# Patient Record
Sex: Female | Born: 1995 | Race: Black or African American | Hispanic: No | Marital: Single | Smoking: Current every day smoker
Health system: Southern US, Community
[De-identification: ages and names within clinical notes are randomized; demographics above are authoritative.]

## PROBLEM LIST (undated history)

## (undated) DIAGNOSIS — R0600 Dyspnea, unspecified: Secondary | ICD-10-CM

---
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
## 2014-03-13 ENCOUNTER — Emergency Department: Payer: Self-pay | Admitting: Emergency Medicine

## 2014-03-16 ENCOUNTER — Emergency Department: Payer: Self-pay | Admitting: Emergency Medicine

## 2014-06-27 ENCOUNTER — Emergency Department: Payer: Self-pay | Admitting: Emergency Medicine

## 2014-09-04 ENCOUNTER — Emergency Department: Payer: Self-pay | Admitting: Emergency Medicine

## 2014-12-18 ENCOUNTER — Emergency Department
Admission: EM | Admit: 2014-12-18 | Discharge: 2014-12-18 | Disposition: A | Discharge status: Home / Self Care | Payer: Self-pay | Attending: Emergency Medicine | Admitting: Emergency Medicine

## 2014-12-18 ENCOUNTER — Encounter: Payer: Self-pay | Admitting: Emergency Medicine

## 2014-12-18 ENCOUNTER — Emergency Department: Payer: Self-pay

## 2014-12-18 DIAGNOSIS — M79601 Pain in right arm: Secondary | ICD-10-CM

## 2014-12-18 DIAGNOSIS — M779 Enthesopathy, unspecified: Secondary | ICD-10-CM | POA: Insufficient documentation

## 2014-12-18 DIAGNOSIS — M79631 Pain in right forearm: Secondary | ICD-10-CM | POA: Insufficient documentation

## 2014-12-18 DIAGNOSIS — Z3202 Encounter for pregnancy test, result negative: Secondary | ICD-10-CM | POA: Insufficient documentation

## 2014-12-18 LAB — POCT PREGNANCY, URINE: Preg Test, Ur: NEGATIVE

## 2014-12-18 MED ORDER — CYCLOBENZAPRINE HCL 10 MG PO TABS: 10.0000 mg | ORAL_TABLET | Freq: Three times a day (TID) | ORAL | Status: DC | PRN

## 2014-12-18 MED ORDER — MELOXICAM 15 MG PO TABS: 15.0000 mg | ORAL_TABLET | Freq: Every day | ORAL | Status: DC | PRN

## 2014-12-18 NOTE — ED Notes (Signed)
Pt here with c/o right forearm pain x2 weeks.

## 2014-12-18 NOTE — ED Provider Notes (Signed)
[facility] Emergency Department Provider Note  ____________________________________________  Time seen: Approximately  155am  I have reviewed the triage vital signs and the nursing notes.   HISTORY  Chief Complaint Wrist Pain   HPI Cindy Rodriguez is a 19 y.o. female presents to the ER for complaint of right forearm pain for approximately 2 weeks. Patient states it was a gradual onset of pain that has continued to worsen. Patient denies known fall or injury. Patient states that she feels that the pain start in the forearm then intermittently shoots upwards and downwards. States the radiating pain is a burning type sensation. Denies tingling or numbness to hand/right arm, or other body part. Patient denies any other pain.  Patient reports she has an approximately 20 pound daughter who is one year old that she is frequently carrying child. Patient states that she always carries her daughter on her right side holding with her right arm, holding her against her right side with her right forearm. States this is when she noticed pain at first. States pain is currently 0/10 when sitting still but states pain is 5/10 with movement. States pain only with movement.   Denies fever, chest pain, shortness of breath, abdominal pain or other pain.   History reviewed. No pertinent past medical history.  There are no active problems to display for this patient.   History reviewed. No pertinent past surgical history.  Current Outpatient Rx  Name  Route  Sig  Dispense  Refill                          Allergies Review of patient's allergies indicates no known allergies.  No family history on file.  Social History History  Substance Use Topics  . Smoking status: Never Smoker   . Smokeless tobacco: Not on file  . Alcohol Use: No    Review of Systems Constitutional: No fever/chills Eyes: No visual changes. ENT: No sore throat. Cardiovascular: Denies chest  pain. Respiratory: Denies shortness of breath. Gastrointestinal: No abdominal pain.  No nausea, no vomiting.  No diarrhea.  No constipation. Genitourinary: Negative for dysuria. Musculoskeletal: Negative for back pain. Skin: Negative for rash. Neurological: Negative for headaches, focal weakness or numbness.  10-point ROS otherwise negative.  ____________________________________________   PHYSICAL EXAM:  VITAL SIGNS: ED Triage Vitals  Enc Vitals Group     BP 12/18/14 1111 130/77 mmHg     Pulse Rate 12/18/14 1111 72     Resp 12/18/14 1111 20     Temp 12/18/14 1111 97.6 F (36.4 C)     Temp Source 12/18/14 1111 Oral     SpO2 12/18/14 1111 100 %     Weight 12/18/14 1111 200 lb (90.719 kg)     Height 12/18/14 1111  (1.549 m)     Head Cir --      Peak Flow --      Pain Score 12/18/14 1112 5     Pain Loc --      Pain Edu? --      Excl. in GC? --     Constitutional: Alert and oriented. Well appearing and in no acute distress. Eyes: Conjunctivae are normal. PERRL. EOMI. Head: Atraumatic. Nose: No congestion/rhinnorhea. Mouth/Throat: Mucous membranes are moist.  Oropharynx non-erythematous. Neck: No stridor.  No cervical spine tenderness to palpation Hematological/Lymphatic/Immunilogical: No cervical lymphadenopathy. Cardiovascular: Normal rate, regular rhythm. Grossly normal heart sounds.  Good peripheral circulation. Respiratory: Normal respiratory effort.  No retractions. Lungs CTAB. Gastrointestinal: Soft and nontender. No distention. No abdominal bruits. No CVA tenderness. Musculoskeletal: No lower extremity tenderness nor edema.  No joint effusions. Right proximal forearm with mild to mod TTP, no swelling, no erythema, skin intact. Pain increases at right proximal forearm with wrist rotation and flexion. No other TTP to right arm. Bilateral hand grips equal. Distal radial pulses equal bilaterally. Full ROM present. Pain in forearm improved with support and pressure  applied to right forearm flexor and extensor muscles.  Left upper extremity nontender.  Neurologic:  Normal speech and language. No gross focal neurologic deficits are appreciated. Speech is normal. No gait instability. Skin:  Skin is warm, dry and intact. No rash noted. Psychiatric: Mood and affect are normal. Speech and behavior are normal.  ____________________________________________   LABS (all labs ordered are listed, but only abnormal results are displayed)  Labs Reviewed  POCT PREGNANCY, URINE  negative _ RADIOLOGY  RIGHT FOREARM - 2 VIEW  COMPARISON: None.  FINDINGS: There is no evidence of fracture or other focal bone lesions. Soft tissues are unremarkable.  IMPRESSION: Normal right forearm.   Electronically Signed By: Lupita RaiderJames Green Jr, M.D. On: 12/18/2014 11:58 ____________________________________________   PROCEDURES  Procedure(s) performed:  SPLINT APPLICATION Date/Time: 1330 pm Authorized by: Renford DillsLindsey Yehonatan Grandison Consent: Verbal consent obtained. Risks and benefits: risks, benefits and alternatives were discussed Consent given by: patient Splint applied by: ed technician Location details: right arm sling Post-procedure: The splinted body part was neurovascularly unchanged following the procedure. Patient tolerance: Patient tolerated the procedure well with no immediate complications.   ____________________________________________    INITIAL IMPRESSION / ASSESSMENT AND PLAN / ED COURSE  Pertinent labs & imaging results that were available during my care of the patient were reviewed by me and considered in my medical decision making (see chart for details).   Right forearm with mild to mod soft tissue TTP, Full ROM. No known injury, xray negative. No signs of infection. Suspect muscle strain vs tendinitis as patient frequently carrying 20 year old  With right arm. Rest, ice, antiinflammatories. Stretch and sling as needed for support. Follow up  with PCP or orthopedic as needed. Discussed return parameters.  ____________________________________________   FINAL CLINICAL IMPRESSION(S) / ED DIAGNOSES  Final diagnoses:  Right arm pain  Tendinitis     Renford DillsLindsey Latica Hohmann, NP 12/18/14 1410  Minna AntisKevin Paduchowski, MD 12/18/14 [phone]

## 2014-12-18 NOTE — Discharge Instructions (Signed)
Take medication as prescribed. Apply ice and stretch multiple times per day as discussed. Worsening for 2 or 3 days to help rest however continue to stretch.   Follow-up with primary care physician or orthopedic next week as needed for continued pain   Return to the ER for new or worsening concerns.   Tendinitis Tendinitis is swelling and inflammation of the tendons. Tendons are band-like tissues that connect muscle to bone. Tendinitis commonly occurs in the:   Shoulders (rotator cuff).  Heels (Achilles tendon).  Elbows (triceps tendon). CAUSES Tendinitis is usually caused by overusing the tendon, muscles, and joints involved. When the tissue surrounding a tendon (synovium) becomes inflamed, it is called tenosynovitis. Tendinitis commonly develops in people whose jobs require repetitive motions. SYMPTOMS  Pain.  Tenderness.  Mild swelling. DIAGNOSIS Tendinitis is usually diagnosed by physical exam. Your health care provider may also order X-rays or other imaging tests. TREATMENT Your health care provider may recommend certain medicines or exercises for your treatment. HOME CARE INSTRUCTIONS   Use a sling or splint for as long as directed by your health care provider until the pain decreases.  Put ice on the injured area.  Put ice in a plastic bag.  Place a towel between your skin and the bag.  Leave the ice on for 15-20 minutes, 3-4 times a day, or as directed by your health care provider.  Avoid using the limb while the tendon is painful. Perform gentle range of motion exercises only as directed by your health care provider. Stop exercises if pain or discomfort increase, unless directed otherwise by your health care provider.  Only take over-the-counter or prescription medicines for pain, discomfort, or fever as directed by your health care provider. SEEK MEDICAL CARE IF:   Your pain and swelling increase.  You develop new, unexplained symptoms, especially increased  numbness in the hands. MAKE SURE YOU:   Understand these instructions.  Will watch your condition.  Will get help right away if you are not doing well or get worse. Document Released: 07/07/2000 Document Revised: 11/24/2013 Document Reviewed: 09/26/2010 [facility] Patient Information 2015 [place]. This information is not intended to replace advice given to you by your health care provider. Make sure you discuss any questions you have with your health care provider.  Musculoskeletal Pain Musculoskeletal pain is muscle and boney aches and pains. These pains can occur in any part of the body. Your caregiver may treat you without knowing the cause of the pain. They may treat you if blood or urine tests, X-rays, and other tests were normal.  CAUSES There is often not a definite cause or reason for these pains. These pains may be caused by a type of germ (virus). The discomfort may also come from overuse. Overuse includes working out too hard when your body is not fit. Boney aches also come from weather changes. Bone is sensitive to atmospheric pressure changes. HOME CARE INSTRUCTIONS   Ask when your test results will be ready. Make sure you get your test results.  Only take over-the-counter or prescription medicines for pain, discomfort, or fever as directed by your caregiver. If you were given medications for your condition, do not drive, operate machinery or power tools, or sign legal documents for 24 hours. Do not drink alcohol. Do not take sleeping pills or other medications that may interfere with treatment.  Continue all activities unless the activities cause more pain. When the pain lessens, slowly resume normal activities. Gradually increase the intensity and duration  of the activities or exercise.  During periods of severe pain, bed rest may be helpful. Lay or sit in any position that is comfortable.  Putting ice on the injured area.  Put ice in a bag.  Place a towel between  your skin and the bag.  Leave the ice on for 15 to 20 minutes, 3 to 4 times a day.  Follow up with your caregiver for continued problems and no reason can be found for the pain. If the pain becomes worse or does not go away, it may be necessary to repeat tests or do additional testing. Your caregiver may need to look further for a possible cause. SEEK IMMEDIATE MEDICAL CARE IF:  You have pain that is getting worse and is not relieved by medications.  You develop chest pain that is associated with shortness or breath, sweating, feeling sick to your stomach (nauseous), or throw up (vomit).  Your pain becomes localized to the abdomen.  You develop any new symptoms that seem different or that concern you. MAKE SURE YOU:   Understand these instructions.  Will watch your condition.  Will get help right away if you are not doing well or get worse. Document Released: 07/10/2005 Document Revised: 10/02/2011 Document Reviewed: 03/14/2013 [facility] Patient Information 2015 [place], [place]. This information is not intended to replace advice given to you by your health care provider. Make sure you discuss any questions you have with your health care provider.

## 2014-12-18 NOTE — ED Notes (Signed)
Pt informed to return if any life threatening symptoms occur.  

## 2015-04-07 ENCOUNTER — Encounter: Payer: Self-pay | Admitting: Emergency Medicine

## 2015-04-07 ENCOUNTER — Emergency Department
Admission: EM | Admit: 2015-04-07 | Discharge: 2015-04-07 | Discharge status: Against Medical Advice | Payer: Self-pay | Attending: Emergency Medicine | Admitting: Emergency Medicine

## 2015-04-07 DIAGNOSIS — S61451A Open bite of right hand, initial encounter: Secondary | ICD-10-CM | POA: Insufficient documentation

## 2015-04-07 DIAGNOSIS — Y9289 Other specified places as the place of occurrence of the external cause: Secondary | ICD-10-CM | POA: Insufficient documentation

## 2015-04-07 DIAGNOSIS — Y998 Other external cause status: Secondary | ICD-10-CM | POA: Insufficient documentation

## 2015-04-07 DIAGNOSIS — Y9389 Activity, other specified: Secondary | ICD-10-CM | POA: Insufficient documentation

## 2015-04-07 NOTE — ED Notes (Signed)
Pt called x 3 with no response by ED tech
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
## 2015-11-24 ENCOUNTER — Emergency Department: Payer: No Typology Code available for payment source

## 2015-11-24 ENCOUNTER — Emergency Department
Admission: EM | Admit: 2015-11-24 | Discharge: 2015-11-24 | Disposition: A | Discharge status: Home / Self Care | Payer: No Typology Code available for payment source | Attending: Emergency Medicine | Admitting: Emergency Medicine

## 2015-11-24 DIAGNOSIS — Y9389 Activity, other specified: Secondary | ICD-10-CM | POA: Diagnosis not present

## 2015-11-24 DIAGNOSIS — Y999 Unspecified external cause status: Secondary | ICD-10-CM | POA: Diagnosis not present

## 2015-11-24 DIAGNOSIS — Z23 Encounter for immunization: Secondary | ICD-10-CM | POA: Insufficient documentation

## 2015-11-24 DIAGNOSIS — S0181XA Laceration without foreign body of other part of head, initial encounter: Secondary | ICD-10-CM | POA: Diagnosis not present

## 2015-11-24 DIAGNOSIS — Y92410 Unspecified street and highway as the place of occurrence of the external cause: Secondary | ICD-10-CM | POA: Insufficient documentation

## 2015-11-24 DIAGNOSIS — IMO0002 Reserved for concepts with insufficient information to code with codable children: Secondary | ICD-10-CM

## 2015-11-24 DIAGNOSIS — R51 Headache: Secondary | ICD-10-CM | POA: Diagnosis present

## 2015-11-24 LAB — POCT PREGNANCY, URINE: Preg Test, Ur: NEGATIVE

## 2015-11-24 MED ORDER — DOUBLE ANTIBIOTIC 500-10000 UNIT/GM EX OINT: TOPICAL_OINTMENT | Freq: Two times a day (BID) | CUTANEOUS | Status: DC

## 2015-11-24 MED ORDER — TETANUS-DIPHTH-ACELL PERTUSSIS 5-2.5-18.5 LF-MCG/0.5 IM SUSP
0.5000 mL | Freq: Once | INTRAMUSCULAR | Status: AC
  Administered 2015-11-24: 0.5 mL via INTRAMUSCULAR
  Filled 2015-11-24: qty 0.5

## 2015-11-24 MED ORDER — TRAMADOL HCL 50 MG PO TABS
50.0000 mg | ORAL_TABLET | Freq: Once | ORAL | Status: AC
  Administered 2015-11-24: 50 mg via ORAL
  Filled 2015-11-24: qty 1

## 2015-11-24 MED ORDER — BACITRACIN ZINC 500 UNIT/GM EX OINT
TOPICAL_OINTMENT | CUTANEOUS | Status: AC
  Administered 2015-11-24: 1
  Filled 2015-11-24: qty 0.9

## 2015-11-24 MED ORDER — TRAMADOL HCL 50 MG PO TABS: 50.0000 mg | ORAL_TABLET | Freq: Four times a day (QID) | ORAL | Status: AC | PRN
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
## 2015-11-26 ENCOUNTER — Emergency Department: Payer: No Typology Code available for payment source

## 2015-11-26 ENCOUNTER — Emergency Department
Admission: EM | Admit: 2015-11-26 | Discharge: 2015-11-26 | Disposition: A | Discharge status: Home / Self Care | Payer: No Typology Code available for payment source | Attending: Emergency Medicine | Admitting: Emergency Medicine

## 2015-11-26 ENCOUNTER — Encounter: Payer: Self-pay | Admitting: Emergency Medicine

## 2015-11-26 DIAGNOSIS — Z79899 Other long term (current) drug therapy: Secondary | ICD-10-CM | POA: Diagnosis not present

## 2015-11-26 DIAGNOSIS — M79601 Pain in right arm: Secondary | ICD-10-CM | POA: Diagnosis not present

## 2015-11-26 DIAGNOSIS — F418 Other specified anxiety disorders: Secondary | ICD-10-CM | POA: Diagnosis not present

## 2015-11-26 DIAGNOSIS — T148 Other injury of unspecified body region: Secondary | ICD-10-CM | POA: Insufficient documentation

## 2015-11-26 DIAGNOSIS — Y9389 Activity, other specified: Secondary | ICD-10-CM | POA: Insufficient documentation

## 2015-11-26 DIAGNOSIS — S4991XA Unspecified injury of right shoulder and upper arm, initial encounter: Secondary | ICD-10-CM | POA: Diagnosis present

## 2015-11-26 DIAGNOSIS — T148XXA Other injury of unspecified body region, initial encounter: Secondary | ICD-10-CM

## 2015-11-26 DIAGNOSIS — Y92410 Unspecified street and highway as the place of occurrence of the external cause: Secondary | ICD-10-CM | POA: Insufficient documentation

## 2015-11-26 DIAGNOSIS — G8911 Acute pain due to trauma: Secondary | ICD-10-CM

## 2015-11-26 DIAGNOSIS — Y999 Unspecified external cause status: Secondary | ICD-10-CM | POA: Diagnosis not present

## 2015-11-26 MED ORDER — DIAZEPAM 2 MG PO TABS: 2.0000 mg | ORAL_TABLET | Freq: Three times a day (TID) | ORAL | Status: DC | PRN

## 2015-11-26 MED ORDER — HYDROCODONE-ACETAMINOPHEN 5-325 MG PO TABS: 1.0000 | ORAL_TABLET | ORAL | Status: DC | PRN
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
## 2016-06-30 ENCOUNTER — Emergency Department
Admission: EM | Admit: 2016-06-30 | Discharge: 2016-06-30 | Disposition: A | Discharge status: Home / Self Care | Payer: Self-pay | Attending: Emergency Medicine | Admitting: Emergency Medicine

## 2016-06-30 DIAGNOSIS — F17210 Nicotine dependence, cigarettes, uncomplicated: Secondary | ICD-10-CM | POA: Insufficient documentation

## 2016-06-30 DIAGNOSIS — Z79899 Other long term (current) drug therapy: Secondary | ICD-10-CM | POA: Insufficient documentation

## 2016-06-30 DIAGNOSIS — N938 Other specified abnormal uterine and vaginal bleeding: Secondary | ICD-10-CM | POA: Insufficient documentation

## 2016-06-30 DIAGNOSIS — Z791 Long term (current) use of non-steroidal anti-inflammatories (NSAID): Secondary | ICD-10-CM | POA: Insufficient documentation

## 2016-06-30 LAB — BASIC METABOLIC PANEL
Anion gap: 8 (ref 5–15)
BUN: 10 mg/dL (ref 6–20)
CO2: 24 mmol/L (ref 22–32)
Calcium: 9.1 mg/dL (ref 8.9–10.3)
Chloride: 107 mmol/L (ref 101–111)
Creatinine, Ser: 0.7 mg/dL (ref 0.44–1.00)
GFR calc Af Amer: >60 mL/min (ref >60)
GFR calc non Af Amer: >60 mL/min (ref >60)
Glucose, Bld: 83 mg/dL (ref 65–99)
Potassium: 3.9 mmol/L (ref 3.5–5.1)
Sodium: 139 mmol/L (ref 135–145)

## 2016-06-30 LAB — CBC
HCT: 38.6 % (ref 35.0–47.0)
Hemoglobin: 13.4 g/dL (ref 12.0–16.0)
MCH: 27.6 pg (ref 26.0–34.0)
MCHC: 34.7 g/dL (ref 32.0–36.0)
MCV: 79.4 fL — ABNORMAL LOW (ref 80.0–100.0)
Platelets: 248 10*3/uL (ref 150–440)
RBC: 4.86 MIL/uL (ref 3.80–5.20)
RDW: 14.3 % (ref 11.5–14.5)
WBC: 7.1 10*3/uL (ref 3.6–11.0)

## 2016-06-30 LAB — PREGNANCY, URINE: Preg Test, Ur: NEGATIVE

## 2016-06-30 LAB — URINALYSIS, COMPLETE (UACMP) WITH MICROSCOPIC
Bacteria, UA: NONE SEEN
Specific Gravity, Urine: 1.026 (ref 1.005–1.030)

## 2016-06-30 LAB — TROPONIN I: Troponin I: <0.03 ng/mL (ref <0.03)

## 2016-06-30 LAB — HCG, QUANTITATIVE, PREGNANCY: hCG, Beta Chain, Quant, S: <1 m[IU]/mL (ref <5)

## 2016-06-30 NOTE — ED Triage Notes (Addendum)
Pt came to Ed via pov c/o vaginal bleeding starting today. Reports heavy bleeding at first and has since slowed down. Reports last period October 30th.

## 2016-06-30 NOTE — ED Provider Notes (Signed)
[facility] Emergency Department Provider Note  Time seen: 1:31 PM  I have reviewed the triage vital signs and the nursing notes.   HISTORY  Chief Complaint Vaginal Bleeding    HPI Cindy Rodriguez is a 20 y.o. female with no past medical history who presents to the emergency department with vaginal bleeding. According to the patient she had her last period at the end of October. States she was laid on her. However over the past 2 days she has began with vaginal bleeding and lower abdominal cramping. Patient does not know if she is pregnant. She also states mild chest discomfort over the past 2-3 days. Denies vaginal discharge. Denies dysuria. Describes her lower abdominal pain is mild, cramping in nature.  History reviewed. No pertinent past medical history.  There are no active problems to display for this patient.   History reviewed. No pertinent surgical history.  Prior to Admission medications   Medication Sig Start Date End Date Taking? Authorizing Provider  cyclobenzaprine (FLEXERIL) 10 MG tablet Take 1 tablet (10 mg total) by mouth every 8 (eight) hours as needed for muscle spasms (PRN pain. Do not drive or operate heavy machinery while taking as can cause drowsiness.). 12/18/14   Renford DillsLindsey Miller, NP  diazepam (VALIUM) 2 MG tablet Take 1 tablet (2 mg total) by mouth every 8 (eight) hours as needed. 11/26/15   Chinita Pesterari B Triplett, FNP  HYDROcodone-acetaminophen (NORCO/VICODIN) 5-325 MG tablet Take 1 tablet by mouth every 4 (four) hours as needed for moderate pain. 11/26/15   Chinita Pesterari B Triplett, FNP  meloxicam (MOBIC) 15 MG tablet Take 1 tablet (15 mg total) by mouth daily as needed for pain. 12/18/14   Renford DillsLindsey Miller, NP  traMADol (ULTRAM) 50 MG tablet Take 1 tablet (50 mg total) by mouth every 6 (six) hours as needed. 11/24/15 11/23/16  Phineas SemenGraydon Goodman, MD    No Known Allergies  No family history on file.  Social History Social History  Substance Use Topics  .  Smoking status: Current Every Day Smoker    Packs/day: 0.50    Types: Cigarettes  . Smokeless tobacco: Never Used  . Alcohol use No    Review of Systems Constitutional: Negative for fever. Cardiovascular: Negative for chest pain. Respiratory: Negative for shortness of breath. Gastrointestinal: Mild lower abdominal cramping. Negative for nausea, vomiting, diarrhea. Genitourinary: Negative for dysuria. Positive for vaginal bleeding 2 days. Neurological: Negative for headache 10-point ROS otherwise negative.  ____________________________________________   PHYSICAL EXAM:  VITAL SIGNS: ED Triage Vitals [06/30/16 1018]  Enc Vitals Group     BP (!) 100/54     Pulse Rate 77     Resp 18     Temp 98.1 F (36.7 C)     Temp Source Oral     SpO2 98 %     Weight 261 lb 9.6 oz (118.7 kg)     Height 5\' 1"  (1.549 m)     Head Circumference      Peak Flow      Pain Score 7     Pain Loc      Pain Edu?      Excl. in GC?    Constitutional: Alert and oriented. Well appearing and in no distress. Eyes: Normal exam ENT   Head: Normocephalic and atraumatic.   Mouth/Throat: Mucous membranes are moist. Cardiovascular: Normal rate, regular rhythm. No murmur Respiratory: Normal respiratory effort without tachypnea nor retractions. Breath sounds are clear  Gastrointestinal: Soft and nontender. No distention.  Musculoskeletal: Nontender with normal range of motion in all extremities.  Neurologic:  Normal speech and language. No gross focal neurologic deficits  Skin:  Skin is warm, dry and intact.  Psychiatric: Mood and affect are normal  ____________________________________________    INITIAL IMPRESSION / ASSESSMENT AND PLAN / ED COURSE  Pertinent labs & imaging results that were available during my care of the patient were reviewed by me and considered in my medical decision making (see chart for details).  Patient presents the emergency department for vaginal bleeding for the  past 2 days. States it is very abnormal for her to have an irregular period, states she should've started her period almost 2 weeks ago, she is concerned that she might be pregnant. Patient's labs today are largely within normal limits. Urinalysis shows significant amount of blood but no bacteria. Patient has a largely normal workup. Did describe some chest tightness however her EKG is normal, troponin is normal. Suspect dysfunctional uterine bleeding. We will discharge home with OB/GYN follow-up. Patient agreeable.     EKG reviewed and interpreted by myself shows normal sinus rhythm at 69 bpm, narrow QRS, normal axis, normal intervals, no concerning ST changes. Overall normal EKG.  ____________________________________________   FINAL CLINICAL IMPRESSION(S) / ED DIAGNOSES  Vaginal bleeding    Minna AntisKevin Farhana Fellows, MD 06/30/16 1334

## 2016-06-30 NOTE — ED Notes (Signed)
Pt c/o vaginal bleeding that started this am with clots, states her LMP was oct 30th, missed period in November. Pt c/o abd cramping in lower abd.

## 2016-08-24 ENCOUNTER — Emergency Department
Admission: EM | Admit: 2016-08-24 | Discharge: 2016-08-24 | Disposition: A | Discharge status: Home / Self Care | Payer: Self-pay | Attending: Emergency Medicine | Admitting: Emergency Medicine

## 2016-08-24 ENCOUNTER — Encounter: Payer: Self-pay | Admitting: Emergency Medicine

## 2016-08-24 DIAGNOSIS — R51 Headache: Secondary | ICD-10-CM | POA: Insufficient documentation

## 2016-08-24 DIAGNOSIS — R519 Headache, unspecified: Secondary | ICD-10-CM

## 2016-08-24 DIAGNOSIS — F17210 Nicotine dependence, cigarettes, uncomplicated: Secondary | ICD-10-CM | POA: Insufficient documentation

## 2016-08-24 MED ORDER — BUTALBITAL-APAP-CAFFEINE 50-325-40 MG PO TABS: 1.0000 | ORAL_TABLET | Freq: Four times a day (QID) | ORAL | 0 refills | Status: AC | PRN

## 2016-08-24 NOTE — ED Provider Notes (Signed)
[facility] Emergency Department Provider Note  ____________________________________________   First MD Initiated Contact with Patient 08/24/16 1900     (approximate)  I have reviewed the triage vital signs and the nursing notes.   HISTORY  Chief Complaint Migraine   HPI Cindy Rodriguez is a 21 y.o. female with a history of headaches in the past was presented to the emergency department today with a right-sided headache that is been intermittent over the past 3 days. She says that the headache has been throbbing and last about 10-15 minutes before to [place]. She denies any nausea, vomiting or visual disturbances. She denies any weakness or numbness. Says that she has tried Percocet at home for her headache but has otherwise not had any relief. She says she has had similar headaches in the past.Denies any headache at this time. Denies any history of ruptured cerebral aneurysms in the past. Denies any sudden death at young age and pupil her family. Does not report any neck pain. Does not report this being the worst headache of her life.   History reviewed. No pertinent past medical history.  There are no active problems to display for this patient.   History reviewed. No pertinent surgical history.  Prior to Admission medications   Medication Sig Start Date End Date Taking? Authorizing Provider  cyclobenzaprine (FLEXERIL) 10 MG tablet Take 1 tablet (10 mg total) by mouth every 8 (eight) hours as needed for muscle spasms (PRN pain. Do not drive or operate heavy machinery while taking as can cause drowsiness.). 12/18/14   Renford DillsLindsey Miller, NP  diazepam (VALIUM) 2 MG tablet Take 1 tablet (2 mg total) by mouth every 8 (eight) hours as needed. 11/26/15   Chinita Pesterari B Triplett, FNP  HYDROcodone-acetaminophen (NORCO/VICODIN) 5-325 MG tablet Take 1 tablet by mouth every 4 (four) hours as needed for moderate pain. 11/26/15   Chinita Pesterari B Triplett, FNP  meloxicam (MOBIC) 15 MG tablet Take  1 tablet (15 mg total) by mouth daily as needed for pain. 12/18/14   Renford DillsLindsey Miller, NP  traMADol (ULTRAM) 50 MG tablet Take 1 tablet (50 mg total) by mouth every 6 (six) hours as needed. 11/24/15 11/23/16  Phineas SemenGraydon Goodman, MD    Allergies Patient has no known allergies.  No family history on file.  Social History Social History  Substance Use Topics  . Smoking status: Current Every Day Smoker    Packs/day: 0.50    Types: Cigarettes  . Smokeless tobacco: Never Used  . Alcohol use No    Review of Systems Constitutional: No fever/chills Eyes: No visual changes. ENT: No sore throat. Cardiovascular: Denies chest pain. Respiratory: Denies shortness of breath. Gastrointestinal: No abdominal pain.  No nausea, no vomiting.  No diarrhea.  No constipation. Genitourinary: Negative for dysuria. Musculoskeletal: Negative for back pain. Skin: Negative for rash. Neurological: Negative for focal weakness or numbness.  10-point ROS otherwise negative.  ____________________________________________   PHYSICAL EXAM:  VITAL SIGNS: ED Triage Vitals  Enc Vitals Group     BP 08/24/16 1804 (!) 102/50     Pulse Rate 08/24/16 1804 76     Resp 08/24/16 1804 16     Temp 08/24/16 1804 98.7 F (37.1 C)     Temp Source 08/24/16 1804 Oral     SpO2 08/24/16 1804 98 %     Weight 08/24/16 1806 200 lb (90.7 kg)     Height 08/24/16 1806 5\' 1"  (1.549 m)     Head Circumference --  Peak Flow --      Pain Score 08/24/16 1836 8     Pain Loc --      Pain Edu? --      Excl. in GC? --     Constitutional: Alert and oriented. Well appearing and in no acute distress. Eyes: Conjunctivae are normal. PERRL. EOMI. Head: Atraumatic. Nose: No congestion/rhinnorhea. Mouth/Throat: Mucous membranes are moist.   Neck: No stridor.  Ranges had neck freely without any pain or restriction observed. Cardiovascular: Normal rate, regular rhythm. Grossly normal heart sounds.   Respiratory: Normal respiratory effort.   No retractions. Lungs CTAB. Gastrointestinal: Soft and nontender. No distention.  Musculoskeletal: No lower extremity tenderness nor edema.  No joint effusions. Neurologic:  Normal speech and language. No gross focal neurologic deficits are appreciated. No gait instability. Walks unassisted with a normal gait. Skin:  Skin is warm, dry and intact. No rash noted. Psychiatric: Mood and affect are normal. Speech and behavior are normal.  ____________________________________________   LABS (all labs ordered are listed, but only abnormal results are displayed)  Labs Reviewed - No data to display ____________________________________________  EKG   ____________________________________________  RADIOLOGY   ____________________________________________   PROCEDURES  Procedure(s) performed:   Procedures  Critical Care performed:   ____________________________________________   INITIAL IMPRESSION / ASSESSMENT AND PLAN / ED COURSE  Pertinent labs & imaging results that were available during my care of the patient were reviewed by me and considered in my medical decision making (see chart for details).  Likely tension headache. Patient says that she has difficulty due to financial reasons obtaining prescription medications. I recommended a salve such as icy hot or BenGay or warm compresses. I also recommended Excedrin Migraine. I'll be giving her prescription for Fioricet coupon as a third line remedy. I will also give her follow-up with [facility] clinic. She continues to be pain-free. Will be discharged home. Likely secondary cephalgia. No objective signs of primary cephalgia.      ____________________________________________   FINAL CLINICAL IMPRESSION(S) / ED DIAGNOSES  Headache.    NEW MEDICATIONS STARTED DURING THIS VISIT:  New Prescriptions   No medications on file     Note:  This document was prepared using Dragon voice recognition software and may include  unintentional dictation errors.    Myrna Blazer, MD 08/24/16 2010

## 2016-08-24 NOTE — ED Triage Notes (Signed)
Pt in via POV with complaints of headache x 3 days, pt reports some dizziness, denies any changes in vision, denies photophobia.  Pt with hx of the same.  NAD noted at this time.

## 2016-08-24 NOTE — ED Notes (Signed)
Pt states she is ready to be discharged and go smoke a cigarette.

## 2016-09-04 ENCOUNTER — Emergency Department: Payer: Self-pay

## 2016-09-04 ENCOUNTER — Encounter: Payer: Self-pay | Admitting: Emergency Medicine

## 2016-09-04 ENCOUNTER — Emergency Department
Admission: EM | Admit: 2016-09-04 | Discharge: 2016-09-05 | Disposition: A | Discharge status: Home / Self Care | Payer: Self-pay | Attending: Emergency Medicine | Admitting: Emergency Medicine

## 2016-09-04 DIAGNOSIS — R112 Nausea with vomiting, unspecified: Secondary | ICD-10-CM

## 2016-09-04 DIAGNOSIS — F17210 Nicotine dependence, cigarettes, uncomplicated: Secondary | ICD-10-CM | POA: Insufficient documentation

## 2016-09-04 DIAGNOSIS — J111 Influenza due to unidentified influenza virus with other respiratory manifestations: Secondary | ICD-10-CM | POA: Insufficient documentation

## 2016-09-04 DIAGNOSIS — Z79899 Other long term (current) drug therapy: Secondary | ICD-10-CM | POA: Insufficient documentation

## 2016-09-04 DIAGNOSIS — R197 Diarrhea, unspecified: Secondary | ICD-10-CM | POA: Insufficient documentation

## 2016-09-04 LAB — COMPREHENSIVE METABOLIC PANEL
ALT: 20 U/L (ref 14–54)
AST: 26 U/L (ref 15–41)
Albumin: 3.9 g/dL (ref 3.5–5.0)
Alkaline Phosphatase: 34 U/L — ABNORMAL LOW (ref 38–126)
Anion gap: 9 (ref 5–15)
BUN: 8 mg/dL (ref 6–20)
CO2: 23 mmol/L (ref 22–32)
Calcium: 8.6 mg/dL — ABNORMAL LOW (ref 8.9–10.3)
Chloride: 101 mmol/L (ref 101–111)
Creatinine, Ser: 0.82 mg/dL (ref 0.44–1.00)
GFR calc Af Amer: >60 mL/min (ref >60)
GFR calc non Af Amer: >60 mL/min (ref >60)
Glucose, Bld: 101 mg/dL — ABNORMAL HIGH (ref 65–99)
Potassium: 3.2 mmol/L — ABNORMAL LOW (ref 3.5–5.1)
Sodium: 133 mmol/L — ABNORMAL LOW (ref 135–145)
Total Bilirubin: 0.4 mg/dL (ref 0.3–1.2)
Total Protein: 7.3 g/dL (ref 6.5–8.1)

## 2016-09-04 LAB — CBC
HCT: 36.9 % (ref 35.0–47.0)
Hemoglobin: 12.9 g/dL (ref 12.0–16.0)
MCH: 27.6 pg (ref 26.0–34.0)
MCHC: 35 g/dL (ref 32.0–36.0)
MCV: 78.8 fL — ABNORMAL LOW (ref 80.0–100.0)
Platelets: 179 10*3/uL (ref 150–440)
RBC: 4.68 MIL/uL (ref 3.80–5.20)
RDW: 14.6 % — ABNORMAL HIGH (ref 11.5–14.5)
WBC: 4.9 10*3/uL (ref 3.6–11.0)

## 2016-09-04 MED ORDER — ACETAMINOPHEN 325 MG PO TABS
650.0000 mg | ORAL_TABLET | Freq: Once | ORAL | Status: AC | PRN
  Administered 2016-09-05: 650 mg via ORAL
  Filled 2016-09-04 (×2): qty 2

## 2016-09-04 MED ORDER — SODIUM CHLORIDE 0.9 % IV BOLUS (SEPSIS)
1000.0000 mL | Freq: Once | INTRAVENOUS | Status: AC
  Administered 2016-09-04: 1000 mL via INTRAVENOUS

## 2016-09-04 MED ORDER — ONDANSETRON 4 MG PO TBDP
4.0000 mg | ORAL_TABLET | Freq: Once | ORAL | Status: AC
  Administered 2016-09-04: 4 mg via ORAL
  Filled 2016-09-04: qty 1

## 2016-09-04 MED ORDER — IBUPROFEN 600 MG PO TABS: 600.0000 mg | ORAL_TABLET | Freq: Once | ORAL | Status: DC

## 2016-09-04 MED ORDER — ONDANSETRON HCL 4 MG/2ML IJ SOLN
4.0000 mg | Freq: Once | INTRAMUSCULAR | Status: AC
  Administered 2016-09-04: 4 mg via INTRAVENOUS
  Filled 2016-09-04: qty 2

## 2016-09-04 MED ORDER — IBUPROFEN 400 MG PO TABS
400.0000 mg | ORAL_TABLET | Freq: Once | ORAL | Status: AC
  Administered 2016-09-04: 400 mg via ORAL

## 2016-09-04 MED ORDER — IBUPROFEN 400 MG PO TABS
ORAL_TABLET | ORAL | Status: AC
  Administered 2016-09-04: 400 mg via ORAL
  Filled 2016-09-04: qty 1

## 2016-09-04 NOTE — ED Notes (Signed)
Pt unable to take oral meds and hold them down at this time

## 2016-09-04 NOTE — ED Triage Notes (Addendum)
Pt presents to ED with c/o fever, vomiting, cough, and congestion, for the past 3 days. Pt currently has no increased work of breathing noted. Frequent cough noted in triage. Not taking anything for her fever. Attempted to treat fever in triage and pt began actively vomiting.

## 2016-09-05 LAB — INFLUENZA PANEL BY PCR (TYPE A & B)
Influenza A By PCR: POSITIVE — AB
Influenza B By PCR: NEGATIVE

## 2016-09-05 MED ORDER — ONDANSETRON HCL 4 MG PO TABS: 4.0000 mg | ORAL_TABLET | Freq: Every day | ORAL | 0 refills | Status: DC | PRN

## 2016-09-05 NOTE — ED Provider Notes (Signed)
[facility] Emergency Department Provider Note  ____________________________________________   First MD Initiated Contact with Patient 09/04/16 2304     (approximate)  I have reviewed the triage vital signs and the nursing notes.   HISTORY  Chief Complaint Fever; Cough; Nasal Congestion; and Emesis   HPI Cindy Rodriguez is a 21 y.o. female who is presenting to the emergency department today with 3 days of fever, cough, nasal congestion as well as nausea vomiting and diarrhea. She says that she is able to keep water down but is occasionally vomiting green vomit with blood streaks. Denies any blood in her stool. Denies any known sick contacts. Denies any headache or ear pressure.   History reviewed. No pertinent past medical history.  There are no active problems to display for this patient.   History reviewed. No pertinent surgical history.  Prior to Admission medications   Medication Sig Start Date End Date Taking? Authorizing Provider  butalbital-acetaminophen-caffeine (FIORICET, ESGIC) 50-325-40 MG tablet Take 1-2 tablets by mouth every 6 (six) hours as needed for headache. 08/24/16 08/24/17  Myrna Blazer, MD  cyclobenzaprine (FLEXERIL) 10 MG tablet Take 1 tablet (10 mg total) by mouth every 8 (eight) hours as needed for muscle spasms (PRN pain. Do not drive or operate heavy machinery while taking as can cause drowsiness.). 12/18/14   Renford Dills, NP  diazepam (VALIUM) 2 MG tablet Take 1 tablet (2 mg total) by mouth every 8 (eight) hours as needed. 11/26/15   Chinita Pester, FNP  HYDROcodone-acetaminophen (NORCO/VICODIN) 5-325 MG tablet Take 1 tablet by mouth every 4 (four) hours as needed for moderate pain. 11/26/15   Chinita Pester, FNP  meloxicam (MOBIC) 15 MG tablet Take 1 tablet (15 mg total) by mouth daily as needed for pain. 12/18/14   Renford Dills, NP  traMADol (ULTRAM) 50 MG tablet Take 1 tablet (50 mg total) by mouth every 6 (six)  hours as needed. 11/24/15 11/23/16  Phineas Semen, MD    Allergies Patient has no known allergies.  No family history on file.  Social History Social History  Substance Use Topics  . Smoking status: Current Every Day Smoker    Packs/day: 0.50    Types: Cigarettes  . Smokeless tobacco: Never Used  . Alcohol use No    Review of Systems Constitutional: fever  Eyes: No visual changes. ENT: No sore throat. Cardiovascular: Denies chest pain. Respiratory: cough Gastrointestinal: No abdominal pain.  No constipation. Genitourinary: Negative for dysuria. Musculoskeletal: Negative for back pain. Skin: Negative for rash. Neurological: Negative for headaches, focal weakness or numbness.  10-point ROS otherwise negative.  ____________________________________________   PHYSICAL EXAM:  VITAL SIGNS: ED Triage Vitals  Enc Vitals Group     BP 09/04/16 2159 115/72     Pulse Rate 09/04/16 2159 (!) 114     Resp 09/04/16 2159 20     Temp 09/04/16 2159 (!) 101.9 F (38.8 C)     Temp Source 09/04/16 2159 Oral     SpO2 09/04/16 2159 96 %     Weight 09/04/16 2159 200 lb (90.7 kg)     Height 09/04/16 2159 5\' 1"  (1.549 m)     Head Circumference --      Peak Flow --      Pain Score 09/04/16 2200 10     Pain Loc --      Pain Edu? --      Excl. in GC? --     Constitutional: Alert and  oriented. Well appearing and in no acute distress. Eyes: Conjunctivae are normal. PERRL. EOMI. Head: Atraumatic. Nose: No congestion/rhinnorhea. Mouth/Throat: Mucous membranes are moist.  Oropharynx non-erythematous. Neck: No stridor.   Cardiovascular: tachycardic, regular rhythm. Grossly normal heart sounds.   Respiratory: Normal respiratory effort.  No retractions. Lungs CTAB. Gastrointestinal: Soft and nontender. No distention.  Musculoskeletal: No lower extremity tenderness nor edema.  No joint effusions. Neurologic:  Normal speech and language. No gross focal neurologic deficits are appreciated.    Skin:  Skin is warm, dry and intact. No rash noted. Psychiatric: Mood and affect are normal. Speech and behavior are normal.  ____________________________________________   LABS (all labs ordered are listed, but only abnormal results are displayed)  Labs Reviewed  CBC - Abnormal; Notable for the following:       Result Value   MCV 78.8 (*)    RDW 14.6 (*)    All other components within normal limits  COMPREHENSIVE METABOLIC PANEL - Abnormal; Notable for the following:    Sodium 133 (*)    Potassium 3.2 (*)    Glucose, Bld 101 (*)    Calcium 8.6 (*)    Alkaline Phosphatase 34 (*)    All other components within normal limits  INFLUENZA PANEL BY PCR (TYPE A & B) - Abnormal; Notable for the following:    Influenza A By PCR POSITIVE (*)    All other components within normal limits   ____________________________________________  EKG   ____________________________________________  RADIOLOGY  DG Chest 2 View (Final result)  Result time 09/04/16 23:00:49  Final result by Burman NievesWilliam Stevens, MD (09/04/16 23:00:49)           Narrative:   CLINICAL DATA: Fever, vomiting, cough, and congestion for 3 days.  EXAM: CHEST 2 VIEW  COMPARISON: 03/13/2014  FINDINGS: The heart size and mediastinal contours are within normal limits. Both lungs are clear. The visualized skeletal structures are unremarkable.  IMPRESSION: No active cardiopulmonary disease.   Electronically Signed By: Burman NievesWilliam Stevens M.D. On: 09/04/2016 23:00            ____________________________________________   PROCEDURES  Procedure(s) performed:   Procedures  Critical Care performed:   ____________________________________________   INITIAL IMPRESSION / ASSESSMENT AND PLAN / ED COURSE  Pertinent labs & imaging results that were available during my care of the patient were reviewed by me and considered in my medical decision making (see chart for details).       ----------------------------------------- 1:57 AM on 09/05/2016 -----------------------------------------  Patient now with temperature of 99.0. Feeling subjectively better after fluids as well as Tylenol and Zofran. Able to tolerate crackers as well. Flu positive. However, said that the symptoms started about 72 hours ago. This puts her out of the window for Tamiflu. I will be giving her Zofran for home use and encouraged staying hydrated with plenty of water as well as Tylenol and ibuprofen for fever and body aches. The patient is understanding of the plan and willing to comply. She'll be discharged to home.  ____________________________________________   FINAL CLINICAL IMPRESSION(S) / ED DIAGNOSES  Influenza. Nausea vomiting and diarrhea.    NEW MEDICATIONS STARTED DURING THIS VISIT:  New Prescriptions   No medications on file     Note:  This document was prepared using Dragon voice recognition software and may include unintentional dictation errors.    Myrna Blazeravid Matthew Schaevitz, MD 09/05/16 [phone]
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
## 2017-09-27 ENCOUNTER — Emergency Department
Admission: EM | Admit: 2017-09-27 | Discharge: 2017-09-27 | Disposition: A | Discharge status: Home / Self Care | Payer: Self-pay | Attending: Emergency Medicine | Admitting: Emergency Medicine

## 2017-09-27 ENCOUNTER — Other Ambulatory Visit: Payer: Self-pay

## 2017-09-27 DIAGNOSIS — K0889 Other specified disorders of teeth and supporting structures: Secondary | ICD-10-CM | POA: Insufficient documentation

## 2017-09-27 DIAGNOSIS — F17210 Nicotine dependence, cigarettes, uncomplicated: Secondary | ICD-10-CM | POA: Insufficient documentation

## 2017-09-27 DIAGNOSIS — R220 Localized swelling, mass and lump, head: Secondary | ICD-10-CM | POA: Insufficient documentation

## 2017-09-27 MED ORDER — AMOXICILLIN 500 MG PO CAPS: 500.0000 mg | ORAL_CAPSULE | Freq: Three times a day (TID) | ORAL | 0 refills | Status: AC

## 2017-09-27 MED ORDER — MELOXICAM 15 MG PO TABS: 15.0000 mg | ORAL_TABLET | Freq: Every day | ORAL | 1 refills | Status: AC

## 2017-09-27 MED ORDER — TRAMADOL HCL 50 MG PO TABS: 50.0000 mg | ORAL_TABLET | Freq: Four times a day (QID) | ORAL | 0 refills | Status: AC | PRN

## 2017-09-27 NOTE — ED Provider Notes (Signed)
[facility] Emergency Department Provider Note  ____________________________________________  Time seen: Approximately 8:32 PM  I have reviewed the triage vital signs and the nursing notes.   HISTORY  Chief Complaint Dental Pain    HPI Cindy Rodriguez is a 22 y.o. female presents to the emergency department with 7 out of 10 superior 1 pain for the past week.  Patient reports that she has "really good dental insurance".  She does not currently have an appointment with a local dentist.  She has noticed right upper jaw swelling.  She denies discharge from the gumline or fever and chills.  Patient has been taking ibuprofen.   No past medical history on file.  There are no active problems to display for this patient.   No past surgical history on file.  Prior to Admission medications   Medication Sig Start Date End Date Taking? Authorizing Provider  amoxicillin (AMOXIL) 500 MG capsule Take 1 capsule (500 mg total) by mouth 3 (three) times daily for 10 days. 09/27/17 10/07/17  Orvil Feil, PA-C  cyclobenzaprine (FLEXERIL) 10 MG tablet Take 1 tablet (10 mg total) by mouth every 8 (eight) hours as needed for muscle spasms (PRN pain. Do not drive or operate heavy machinery while taking as can cause drowsiness.). 12/18/14   Renford Dills, NP  diazepam (VALIUM) 2 MG tablet Take 1 tablet (2 mg total) by mouth every 8 (eight) hours as needed. 11/26/15   Triplett, Rulon Eisenmenger B, FNP  HYDROcodone-acetaminophen (NORCO/VICODIN) 5-325 MG tablet Take 1 tablet by mouth every 4 (four) hours as needed for moderate pain. 11/26/15   Triplett, Rulon Eisenmenger B, FNP  meloxicam (MOBIC) 15 MG tablet Take 1 tablet (15 mg total) by mouth daily for 7 days. 09/27/17 10/04/17  Orvil Feil, PA-C  ondansetron (ZOFRAN) 4 MG tablet Take 1 tablet (4 mg total) by mouth daily as needed. 09/05/16   Schaevitz, Myra Rude, MD  traMADol (ULTRAM) 50 MG tablet Take 1 tablet (50 mg total) by mouth every 6 (six) hours as  needed for up to 3 days. 09/27/17 09/30/17  Orvil Feil, PA-C    Allergies Patient has no known allergies.  No family history on file.  Social History Social History   Tobacco Use  . Smoking status: Current Every Day Smoker    Packs/day: 0.50    Types: Cigarettes  . Smokeless tobacco: Never Used  Substance Use Topics  . Alcohol use: No  . Drug use: No     Review of Systems  Constitutional: No fever/chills Eyes: No visual changes. No discharge ENT: Patient has Superior 1 pain.  Cardiovascular: no chest pain. Respiratory: no cough. No SOB. Gastrointestinal: No abdominal pain.  No nausea, no vomiting.  No diarrhea.  No constipation. Musculoskeletal: Negative for musculoskeletal pain. Skin: Negative for rash, abrasions, lacerations, ecchymosis. Neurological: Negative for headaches, focal weakness or numbness.   ____________________________________________   PHYSICAL EXAM:  VITAL SIGNS: ED Triage Vitals  Enc Vitals Group     BP 09/27/17 2002 139/77     Pulse Rate 09/27/17 2002 86     Resp 09/27/17 2002 20     Temp 09/27/17 2002 98.2 F (36.8 C)     Temp Source 09/27/17 2002 Oral     SpO2 09/27/17 2002 100 %     Weight 09/27/17 2002 198 lb (89.8 kg)     Height 09/27/17 2002 5\' 1"  (1.549 m)     Head Circumference --      Peak Flow --  Pain Score 09/27/17 2001 9     Pain Loc --      Pain Edu? --      Excl. in GC? --      Constitutional: Alert and oriented. Well appearing and in no acute distress. Eyes: Conjunctivae are normal. PERRL. EOMI. Head: Atraumatic. ENT:      Ears: TMs are pearly.      Nose: No congestion/rhinnorhea.      Mouth/Throat: Mucous membranes are moist.  Patient has superior 1 pain.  Patient has mild edema of the right upper jaw. Cardiovascular: Normal rate, regular rhythm. Normal S1 and S2.  Good peripheral circulation. Respiratory: Normal respiratory effort without tachypnea or retractions. Lungs CTAB. Good air entry to the bases  with no decreased or absent breath sounds. Skin:  Skin is warm, dry and intact. No rash noted.  ____________________________________________   LABS (all labs ordered are listed, but only abnormal results are displayed)  Labs Reviewed - No data to display ____________________________________________  EKG   ____________________________________________  RADIOLOGY   No results found.  ____________________________________________    PROCEDURES  Procedure(s) performed:    Procedures    Medications - No data to display   ____________________________________________   INITIAL IMPRESSION / ASSESSMENT AND PLAN / ED COURSE  Pertinent labs & imaging results that were available during my care of the patient were reviewed by me and considered in my medical decision making (see chart for details).  Review of the [place] CSRS was performed in accordance of the [facility] prior to dispensing any controlled drugs.     Assessment and plan Dental pain Patient presents to the emergency department with dental pain.  Patient was advised to make an appointment with a local dentist as soon as possible.  She was discharged with amoxicillin, meloxicam, and a short course of tramadol. All patient questions were answered.    ____________________________________________  FINAL CLINICAL IMPRESSION(S) / ED DIAGNOSES  Final diagnoses:  Pain, dental      NEW MEDICATIONS STARTED DURING THIS VISIT:  ED Discharge Orders        Ordered    meloxicam (MOBIC) 15 MG tablet  Daily     09/27/17 2013    traMADol (ULTRAM) 50 MG tablet  Every 6 hours PRN     09/27/17 2013    amoxicillin (AMOXIL) 500 MG capsule  3 times daily     09/27/17 2013          This chart was dictated using voice recognition software/Dragon. Despite best efforts to proofread, errors can occur which can change the meaning. Any change was purely unintentional.    Orvil FeilWoods, Jaclyn M, PA-C 09/27/17 2035     Minna AntisPaduchowski, Kevin, MD 09/27/17 2222

## 2017-09-27 NOTE — ED Triage Notes (Signed)
Pt in with co toothache x 1 week  

## 2017-09-27 NOTE — Discharge Instructions (Signed)
OPTIONS FOR DENTAL FOLLOW UP CARE ° °[place] Department of Health and Human Services - Local Safety Net Dental Clinics °http://www.ncdhhs.gov/dph/oralhealth/services/safetynetclinics.htm °  °[facility] (336-562-3123) ° °[facility] [place] ([phone]) ° °[facility] ([phone] ext [phone]) ° °[facility] ([phone]) ° °[facility] ([phone]) °This clinic caters to the indigent population and is on a lottery system. °Location: °[facility], [address], [place] °Clinic Hours: °Wednesdays from 6pm - 9pm, patients seen by a lottery system. °For dates, call or go to [email or web address].[email or web address]/[facility]/patients/Dental-[facility] °Services: °Cleanings, fillings and simple extractions. °Payment Options: °DENTAL WORK IS FREE OF CHARGE. Bring proof of income or support. °Best way to get seen: °Arrive at 5:15 pm - this is a lottery, NOT first come/first serve, so arriving earlier will not increase your chances of being seen. °  °  °[facility] °[phone] °Select option 1 for emergencies °  °Location: °[facility], [address], [place] °Clinic Hours: °No walk-ins accepted - call the day before to schedule an appointment. °Check in times are 9:30 am and 1:30 pm. °Services: °Simple extractions, temporary fillings, pulpectomy/pulp debridement, uncomplicated abscess drainage. °Payment Options: °PAYMENT IS DUE AT THE TIME OF SERVICE.  Fee is usually $100-200, additional surgical procedures (e.g. abscess drainage) may be extra. °Cash, checks, Visa/MasterCard accepted.  Can file Medicaid if patient is covered for dental - patient should call case worker to check. °No discount for [facility] patients. °Best way to get seen: °MUST call the day before and get onto the schedule. Can usually be seen the next 1-2 days. No walk-ins accepted. °  °  °[facility] °[phone] °   °Location: °[facility], [address], [place] °Clinic Hours: °M, W, Th, F 8am or 1:30pm, Tues 9a or 1:30 - first come/first served. °Services: °Simple extractions, temporary fillings, uncomplicated abscess drainage.  You do not need to be an [place] resident. °Payment Options: °PAYMENT IS DUE AT THE TIME OF SERVICE. °Dental insurance, otherwise sliding scale - bring proof of income or support. °Depending on income and treatment needed, cost is usually $50-200. °Best way to get seen: °Arrive early as it is first come/first served. °  °  °[facility] °[phone] °  °Location: °[address] °Clinic Hours: °Mon-Thu 8a-5p °Services: °Most basic dental services including extractions and fillings. °Payment Options: °PAYMENT IS DUE AT THE TIME OF SERVICE. °Sliding scale, up to 50% off - bring proof if income or support. °Medicaid with dental option accepted. °Best way to get seen: °Call to schedule an appointment, can usually be seen within 2 weeks OR they will try to see walk-ins - show up at 8a or 2p (you may have to wait). °  °  °[facility] °[phone] °[place] RESIDENTS ONLY °  °Location: °[facility], [address], [place],  [address] °Clinic Hours: By appointment only. °Monday - Thursday 8am-5pm, Friday 8am-12pm °Services: Cleanings, fillings, extractions. °Payment Options: °PAYMENT IS DUE AT THE TIME OF SERVICE. °Cash, Visa or MasterCard. Sliding scale - $30 minimum per service. °Best way to get seen: °Come in to office, complete packet and make an appointment - need proof of income °or support monies for each household member and proof of [place] residence. °Usually takes about a month to get in. °  °  °[facility] °[phone] °  °Location: °[address]   [place] °Clinic Hours: Walk-in Urgent Care Dental Services are offered Monday-Friday  mornings only. °The numbers of emergencies accepted daily is limited to the number of °providers available. °Maximum 15 - Mondays, Wednesdays & Thursdays °Maximum 10 - Tuesdays & Fridays °Services: °You do not need to be a [place] resident to be seen for a dental emergency. °Emergencies are defined as pain, swelling, abnormal bleeding, or dental trauma. Walkins will receive x-rays if needed. °NOTE: Dental cleaning is not an emergency. °Payment Options: °PAYMENT IS DUE AT THE TIME OF SERVICE. °Minimum co-pay is $40.00 for uninsured patients. °Minimum co-pay is $3.00 for Medicaid with dental coverage. °Dental Insurance is accepted and must be presented at time of visit. °Medicare does not cover dental. °Forms of payment: Cash, credit card, checks. °Best way to get seen: °If not previously registered with the clinic, walk-in dental registration begins at 7:15 am and is on a first come/first serve basis. °If previously registered with the clinic, call to make an appointment. °  °  °[facility] °[phone] °[place] RESIDENTS ONLY °  °Location: °[address], [place], [place] °Clinic Hours: °Mon-Thu 10a-2p °Services: Extractions only! °Payment Options: °FREE (donations accepted) - bring proof of income or support °Best way to get seen: °Call and schedule an appointment OR come at 8am on the 1st Monday of every month (except for holidays) when it is first come/first served. °  °  °[facility] °[phone] °  °Location: °[address], [place] °Clinic Hours: °Friday mornings °Services, Payment Options, Best way to get seen: °Call for info °

## 2017-12-03 ENCOUNTER — Emergency Department
Admission: EM | Admit: 2017-12-03 | Discharge: 2017-12-03 | Disposition: A | Discharge status: Home / Self Care | Payer: Self-pay | Attending: Emergency Medicine | Admitting: Emergency Medicine

## 2017-12-03 ENCOUNTER — Other Ambulatory Visit: Payer: Self-pay

## 2017-12-03 DIAGNOSIS — Z5321 Procedure and treatment not carried out due to patient leaving prior to being seen by health care provider: Secondary | ICD-10-CM | POA: Insufficient documentation

## 2017-12-03 DIAGNOSIS — R6884 Jaw pain: Secondary | ICD-10-CM | POA: Insufficient documentation

## 2017-12-03 DIAGNOSIS — K0889 Other specified disorders of teeth and supporting structures: Secondary | ICD-10-CM | POA: Insufficient documentation

## 2017-12-03 NOTE — ED Triage Notes (Signed)
Pt in with right jaw pain states has had toothache for last few over a month. States has not seen dentist because she had to get abscess treated first. Pt was treated with amoxicillin for same a few weeks ago.

## 2017-12-03 NOTE — ED Notes (Signed)
Without looking at or acknowledging this tech when approached, pt walked out of room and stated, "I'll just go home and take ibuprofen." Pt slammed the door when exiting to the lobby.

## 2018-01-08 ENCOUNTER — Other Ambulatory Visit: Payer: Self-pay

## 2018-01-08 ENCOUNTER — Encounter: Payer: Self-pay | Admitting: Emergency Medicine

## 2018-01-08 ENCOUNTER — Emergency Department
Admission: EM | Admit: 2018-01-08 | Discharge: 2018-01-08 | Disposition: A | Discharge status: Home / Self Care | Payer: Self-pay | Attending: Emergency Medicine | Admitting: Emergency Medicine

## 2018-01-08 DIAGNOSIS — F17210 Nicotine dependence, cigarettes, uncomplicated: Secondary | ICD-10-CM | POA: Insufficient documentation

## 2018-01-08 DIAGNOSIS — J020 Streptococcal pharyngitis: Secondary | ICD-10-CM | POA: Insufficient documentation

## 2018-01-08 MED ORDER — AMOXICILLIN 400 MG/5ML PO SUSR: 500.0000 mg | Freq: Three times a day (TID) | ORAL | 0 refills | Status: DC

## 2018-01-08 MED ORDER — PREDNISOLONE SODIUM PHOSPHATE 15 MG/5ML PO SOLN: 30.0000 mg | Freq: Every day | ORAL | 0 refills | Status: AC

## 2018-01-08 NOTE — ED Provider Notes (Signed)
[facility] Emergency Department Provider Note  ____________________________________________  Time seen: Approximately 5:57 PM  I have reviewed the triage vital signs and the nursing notes.   HISTORY  Chief Complaint Sore Throat    HPI Cindy Rodriguez is a 22 y.o. female who presents to the emergency department for treatment and evaluation of sore throat. Symptoms started 2 days ago. No alleviating measures attempted prior to arrival. No known fever.   History reviewed. No pertinent past medical history.  There are no active problems to display for this patient.   History reviewed. No pertinent surgical history.  Prior to Admission medications   Medication Sig Start Date End Date Taking? Authorizing Provider  amoxicillin (AMOXIL) 400 MG/5ML suspension Take 6.3 mLs (500 mg total) by mouth 3 (three) times daily. 01/08/18   Aleatha Taite, Rulon Eisenmengerari B, FNP  cyclobenzaprine (FLEXERIL) 10 MG tablet Take 1 tablet (10 mg total) by mouth every 8 (eight) hours as needed for muscle spasms (PRN pain. Do not drive or operate heavy machinery while taking as can cause drowsiness.). 12/18/14   Renford DillsMiller, Lindsey, NP  diazepam (VALIUM) 2 MG tablet Take 1 tablet (2 mg total) by mouth every 8 (eight) hours as needed. 11/26/15   Fiona Coto, Rulon Eisenmengerari B, FNP  HYDROcodone-acetaminophen (NORCO/VICODIN) 5-325 MG tablet Take 1 tablet by mouth every 4 (four) hours as needed for moderate pain. 11/26/15   Christyan Reger B, FNP  ondansetron (ZOFRAN) 4 MG tablet Take 1 tablet (4 mg total) by mouth daily as needed. 09/05/16   Schaevitz, Myra Rudeavid Matthew, MD  prednisoLONE (ORAPRED) 15 MG/5ML solution Take 10 mLs (30 mg total) by mouth daily for 3 days. 01/08/18 01/11/18  Chinita Pesterriplett, Edie Vallandingham B, FNP    Allergies Patient has no known allergies.  History reviewed. No pertinent family history.  Social History Social History   Tobacco Use  . Smoking status: Current Every Day Smoker    Packs/day: 0.50    Types: Cigarettes   . Smokeless tobacco: Never Used  Substance Use Topics  . Alcohol use: No  . Drug use: No    Review of Systems Constitutional: Negative for fever. Eyes: No visual changes. ENT: Positive for sore throat; negative for difficulty swallowing. Respiratory: Denies shortness of breath. Gastrointestinal: No abdominal pain.  No nausea, no vomiting.  No diarrhea.  Genitourinary: Negative for dysuria. Musculoskeletal: Negative for generalized body aches. Skin: Negative for rash. Neurological: Negative for headaches, negative for focal weakness or numbness.  ____________________________________________   PHYSICAL EXAM:  VITAL SIGNS: ED Triage Vitals  Enc Vitals Group     BP 01/08/18 1740 103/65     Pulse Rate 01/08/18 1740 79     Resp 01/08/18 1740 19     Temp 01/08/18 1740 99 F (37.2 C)     Temp Source 01/08/18 1740 Oral     SpO2 01/08/18 1740 100 %     Weight 01/08/18 1740 200 lb (90.7 kg)     Height 01/08/18 1740 5\' 1"  (1.549 m)     Head Circumference --      Peak Flow --      Pain Score 01/08/18 1745 10     Pain Loc --      Pain Edu? --      Excl. in GC? --     Constitutional: Alert and oriented. Well appearing and in no acute distress. Eyes: Conjunctivae are normal.  Head: Atraumatic. Nose: No congestion/rhinnorhea. Mouth/Throat: Mucous membranes are moist.  Oropharynx erythematous, tonsils 3+ with exudate. Uvula  is midline. Neck: No stridor.  Lymphatic: Anterior cervical nodes tender on palpation. Cardiovascular: Normal rate, regular rhythm. Good peripheral circulation. Respiratory: Normal respiratory effort. Lungs CTAB. Gastrointestinal: Soft and nontender. Musculoskeletal: No lower extremity tenderness nor edema.  Neurologic:  Normal speech and language. No gross focal neurologic deficits are appreciated. Speech is normal. No gait instability. Skin:  Skin is warm, dry and intact. No rash noted Psychiatric: Mood and affect are normal. Speech and behavior are  normal.  ____________________________________________   LABS (all labs ordered are listed, but only abnormal results are displayed)  Labs Reviewed - No data to display ____________________________________________  EKG  Not indiated ____________________________________________  RADIOLOGY  Not indicated. ____________________________________________   PROCEDURES  Procedure(s) performed: None  Critical Care performed: No ____________________________________________   INITIAL IMPRESSION / ASSESSMENT AND PLAN / ED COURSE  22 year old female presenting to the emergency department for treatment and evaluation of sore throat. Her voice is normal and she is tolerating secretions. She has requested liquid medications due to pain with swallowing. She is to follow up with the primary care provider for symptoms that ar not improving over the next few days. She is to return to the ER for symptoms that change or worsen if unable to schedule an appointment.  Pertinent labs & imaging results that were available during my care of the patient were reviewed by me and considered in my medical decision making (see chart for details). ____________________________________________  Discharge Medication List as of 01/08/2018  6:11 PM    START taking these medications   Details  amoxicillin (AMOXIL) 400 MG/5ML suspension Take 6.3 mLs (500 mg total) by mouth 3 (three) times daily., Starting Tue 01/08/2018, Print    prednisoLONE (ORAPRED) 15 MG/5ML solution Take 10 mLs (30 mg total) by mouth daily for 3 days., Starting Tue 01/08/2018, Until Fri 01/11/2018, Print        FINAL CLINICAL IMPRESSION(S) / ED DIAGNOSES  Final diagnoses:  Strep pharyngitis    If controlled substance prescribed during this visit, 12 month history viewed on the [place] prior to issuing an initial prescription for Schedule II or III opiod.   Note:  This document was prepared using Dragon voice recognition software and may  include unintentional dictation errors.    Chinita Pester, FNP 01/08/18 1904    Dionne Bucy, MD 01/08/18 2027

## 2018-01-08 NOTE — ED Triage Notes (Signed)
Pt states drank a margarita drink 2 days ago and approx 5 mins later she had difficulty swallowing and felt like her throat was closing. Pt states today that she has a sore throat. Pt maintaining her own secretions at this time. Pt in no respiratory distress at this time.

## 2018-03-30 ENCOUNTER — Other Ambulatory Visit: Payer: Self-pay

## 2018-03-30 ENCOUNTER — Encounter: Payer: Self-pay | Admitting: Emergency Medicine

## 2018-03-30 ENCOUNTER — Emergency Department
Admission: EM | Admit: 2018-03-30 | Discharge: 2018-03-30 | Disposition: A | Discharge status: Home / Self Care | Payer: Self-pay | Attending: Emergency Medicine | Admitting: Emergency Medicine

## 2018-03-30 DIAGNOSIS — Z5321 Procedure and treatment not carried out due to patient leaving prior to being seen by health care provider: Secondary | ICD-10-CM | POA: Insufficient documentation

## 2018-03-30 DIAGNOSIS — R1010 Upper abdominal pain, unspecified: Secondary | ICD-10-CM | POA: Insufficient documentation

## 2018-03-30 DIAGNOSIS — R112 Nausea with vomiting, unspecified: Secondary | ICD-10-CM | POA: Insufficient documentation

## 2018-03-30 LAB — CBC
HCT: 37.5 % (ref 35.0–47.0)
Hemoglobin: 12.7 g/dL (ref 12.0–16.0)
MCH: 28.5 pg (ref 26.0–34.0)
MCHC: 34 g/dL (ref 32.0–36.0)
MCV: 84 fL (ref 80.0–100.0)
Platelets: 248 10*3/uL (ref 150–440)
RBC: 4.46 MIL/uL (ref 3.80–5.20)
RDW: 15.2 % — ABNORMAL HIGH (ref 11.5–14.5)
WBC: 6.7 10*3/uL (ref 3.6–11.0)

## 2018-03-30 LAB — COMPREHENSIVE METABOLIC PANEL
ALT: 12 U/L (ref 0–44)
AST: 16 U/L (ref 15–41)
Albumin: 3.8 g/dL (ref 3.5–5.0)
Alkaline Phosphatase: 45 U/L (ref 38–126)
Anion gap: 4 — ABNORMAL LOW (ref 5–15)
BUN: 17 mg/dL (ref 6–20)
CO2: 27 mmol/L (ref 22–32)
Calcium: 9 mg/dL (ref 8.9–10.3)
Chloride: 107 mmol/L (ref 98–111)
Creatinine, Ser: 0.71 mg/dL (ref 0.44–1.00)
GFR calc Af Amer: >60 mL/min (ref >60)
GFR calc non Af Amer: >60 mL/min (ref >60)
Glucose, Bld: 77 mg/dL (ref 70–99)
Potassium: 4.2 mmol/L (ref 3.5–5.1)
Sodium: 138 mmol/L (ref 135–145)
Total Bilirubin: 0.5 mg/dL (ref 0.3–1.2)
Total Protein: 6.9 g/dL (ref 6.5–8.1)

## 2018-03-30 LAB — URINALYSIS, COMPLETE (UACMP) WITH MICROSCOPIC
Bacteria, UA: NONE SEEN
Bilirubin Urine: NEGATIVE
Glucose, UA: NEGATIVE mg/dL
Hgb urine dipstick: NEGATIVE
Ketones, ur: NEGATIVE mg/dL
Leukocytes, UA: NEGATIVE
Nitrite: NEGATIVE
Protein, ur: NEGATIVE mg/dL
Specific Gravity, Urine: 1.025 (ref 1.005–1.030)
pH: 7 (ref 5.0–8.0)

## 2018-03-30 LAB — LIPASE, BLOOD: Lipase: 40 U/L (ref 11–51)

## 2018-03-30 LAB — POCT PREGNANCY, URINE: Preg Test, Ur: NEGATIVE

## 2018-03-30 NOTE — ED Triage Notes (Addendum)
Pt reports episodes of vomiting since yesterday; now having upper abd pain after vomiting spells; pt says there is a chance she's pregnant, "we've been trying"; denies urinary s/s; pt understands nothing to eat or drink until seen by provider

## 2018-04-01 ENCOUNTER — Telehealth: Payer: Self-pay | Admitting: Emergency Medicine
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
## 2018-04-24 ENCOUNTER — Encounter: Payer: Self-pay | Admitting: Emergency Medicine

## 2018-04-24 ENCOUNTER — Emergency Department
Admission: EM | Admit: 2018-04-24 | Discharge: 2018-04-24 | Disposition: A | Discharge status: Home / Self Care | Payer: Self-pay | Attending: Emergency Medicine | Admitting: Emergency Medicine

## 2018-04-24 ENCOUNTER — Emergency Department: Payer: Self-pay

## 2018-04-24 DIAGNOSIS — F17210 Nicotine dependence, cigarettes, uncomplicated: Secondary | ICD-10-CM | POA: Insufficient documentation

## 2018-04-24 DIAGNOSIS — M25511 Pain in right shoulder: Secondary | ICD-10-CM | POA: Insufficient documentation

## 2018-04-24 MED ORDER — MELOXICAM 15 MG PO TABS: 15.0000 mg | ORAL_TABLET | Freq: Every day | ORAL | 2 refills | Status: AC

## 2018-04-24 MED ORDER — IBUPROFEN 800 MG PO TABS
800.0000 mg | ORAL_TABLET | Freq: Once | ORAL | Status: AC
  Administered 2018-04-24: 800 mg via ORAL
  Filled 2018-04-24: qty 1

## 2018-04-24 NOTE — ED Provider Notes (Signed)
[facility] Emergency Department Provider Note  ____________________________________________   First MD Initiated Contact with Patient 04/24/18 1351     (approximate)  I have reviewed the triage vital signs and the nursing notes.   HISTORY  Chief Complaint Shoulder Pain    HPI Cindy Rodriguez is a 22 y.o. female presents with right shoulder pain.  She denies any known injury but does reach overhead a lot at work.  She states pain increases with movement.  She states she cannot move it across her chest or behind her back to put on her bra off.  She denies any numbness or tingling.  No history of IV drug use or recent IV use that could cause infection.    History reviewed. No pertinent past medical history.  There are no active problems to display for this patient.   History reviewed. No pertinent surgical history.  Prior to Admission medications   Medication Sig Start Date End Date Taking? Authorizing Provider  meloxicam (MOBIC) 15 MG tablet Take 1 tablet (15 mg total) by mouth daily. 04/24/18 04/24/19  Faythe Ghee, PA-C    Allergies Patient has no known allergies.  No family history on file.  Social History Social History   Tobacco Use  . Smoking status: Current Every Day Smoker    Packs/day: 0.50    Types: Cigarettes  . Smokeless tobacco: Never Used  Substance Use Topics  . Alcohol use: No  . Drug use: No    Review of Systems  Constitutional: No fever/chills Eyes: No visual changes. ENT: No sore throat. Respiratory: Denies cough Genitourinary: Negative for dysuria. Musculoskeletal: Negative for back pain.  Positive for right shoulder pain Skin: Negative for rash.    ____________________________________________   PHYSICAL EXAM:  VITAL SIGNS: ED Triage Vitals  Enc Vitals Group     BP 04/24/18 1341 118/73     Pulse Rate 04/24/18 1341 84     Resp 04/24/18 1341 18     Temp 04/24/18 1341 98.6 F (37 C)     Temp Source  04/24/18 1341 Oral     SpO2 04/24/18 1341 99 %     Weight 04/24/18 1345 214 lb 4.8 oz (97.2 kg)     Height 04/24/18 1343 5\' 1"  (1.549 m)     Head Circumference --      Peak Flow --      Pain Score 04/24/18 1341 0     Pain Loc --      Pain Edu? --      Excl. in GC? --     Constitutional: Alert and oriented. Well appearing and in no acute distress. Eyes: Conjunctivae are normal.  Head: Atraumatic. Nose: No congestion/rhinnorhea. Mouth/Throat: Mucous membranes are moist.   Neck:  supple no lymphadenopathy noted Cardiovascular: Normal rate, regular rhythm. Heart sounds are normal Respiratory: Normal respiratory effort.  No retractions, lungs c t a  GU: deferred Musculoskeletal: Positive for right shoulder tenderness at the anterior ACJ and across the rotator cuff.  Decreased range of motion due to discomfort.  Patient has full passive range of motion.  Neurovascular is intact.   Neurologic:  Normal speech and language.  Skin:  Skin is warm, dry and intact. No rash noted. Psychiatric: Mood and affect are normal. Speech and behavior are normal.  ____________________________________________   LABS (all labs ordered are listed, but only abnormal results are displayed)  Labs Reviewed - No data to display ____________________________________________   ____________________________________________  RADIOLOGY  Cathlean Sauer  of the right shoulder is negative for any acute bony abnormality  ____________________________________________   PROCEDURES  Procedure(s) performed: Sling  Procedures    ____________________________________________   INITIAL IMPRESSION / ASSESSMENT AND PLAN / ED COURSE  Pertinent labs & imaging results that were available during my care of the patient were reviewed by me and considered in my medical decision making (see chart for details).   Patient is 22 year old female presents emergency department complaining of right shoulder pain.  She states she does  use her right shoulder a lot at work with reaching above her etc.  She states she awoke with the pain.  She is unable to reach over her chest or behind her to put her bra.  On physical exam the right shoulder is tender at the ACJ and the joint of the shoulder.  Decreased range of motion on intentional due to discomfort.  Pain is reproduced with passive range of motion.  Neurovascular is intact.  X-ray of the right shoulder is negative for any acute bony abnormality.  Explained the x-ray results to the patient.  Explained to her that if it is a rotator cuff injury that she will need to see orthopedics.  She was placed in a sling and given ibuprofen while here in the ED.  Was given prescription for meloxicam 15 mg daily.  She is to apply ice to the right shoulder.  The use of the right arm for 1 week.  She is to follow-up with orthopedics if not better by Monday.  Return to emergency department if worsening.  She states she understands will comply with treatment plan.  She was discharged in stable condition.     As part of my medical decision making, I reviewed the following data within the electronic MEDICAL RECORD NUMBER Nursing notes reviewed and incorporated, Old chart reviewed, Radiograph reviewed x-ray of the right shoulder is negative, Notes from prior ED visits and [place] Controlled Substance Database  ____________________________________________   FINAL CLINICAL IMPRESSION(S) / ED DIAGNOSES  Final diagnoses:  Acute pain of right shoulder      NEW MEDICATIONS STARTED DURING THIS VISIT:  Discharge Medication List as of 04/24/2018  2:44 PM    START taking these medications   Details  meloxicam (MOBIC) 15 MG tablet Take 1 tablet (15 mg total) by mouth daily., Starting Wed 04/24/2018, Until Thu 04/24/2019, Normal         Note:  This document was prepared using Dragon voice recognition software and may include unintentional dictation errors.    Faythe Ghee, PA-C 04/24/18 1612      Emily Filbert, MD 04/25/18 [id number]

## 2018-04-24 NOTE — ED Notes (Signed)
See triage note  Presents with right shoulder pain  States pain started this am denies any injury  But states pain increases with movement  No deformity noted

## 2018-04-24 NOTE — Discharge Instructions (Signed)
Follow-up with your regular doctor or Dr. Odis Luster if you are not better 5 7 days.  Return emergency department if worsening.  Use medication as prescribed.  Wear the sling for several days to decrease inflammation.

## 2018-04-24 NOTE — ED Triage Notes (Addendum)
Patient presents to the ED with right shoulder pain since yesterday evening.  Patient denies any known injury to her shoulder. Patient reports limited movement to shoulder.  No pain when she keeps it still but severe pain with movement.

## 2018-06-11 LAB — HM HIV SCREENING LAB: HM HIV Screening: NEGATIVE

## 2018-06-19 ENCOUNTER — Emergency Department (HOSPITAL_COMMUNITY): Payer: Self-pay

## 2018-06-19 ENCOUNTER — Other Ambulatory Visit: Payer: Self-pay

## 2018-06-19 ENCOUNTER — Emergency Department (HOSPITAL_COMMUNITY)
Admission: EM | Admit: 2018-06-19 | Discharge: 2018-06-19 | Disposition: A | Discharge status: Home / Self Care | Payer: Self-pay | Attending: Emergency Medicine | Admitting: Emergency Medicine

## 2018-06-19 ENCOUNTER — Encounter (HOSPITAL_COMMUNITY): Payer: Self-pay

## 2018-06-19 DIAGNOSIS — N12 Tubulo-interstitial nephritis, not specified as acute or chronic: Secondary | ICD-10-CM | POA: Insufficient documentation

## 2018-06-19 DIAGNOSIS — Z79899 Other long term (current) drug therapy: Secondary | ICD-10-CM | POA: Insufficient documentation

## 2018-06-19 DIAGNOSIS — F17210 Nicotine dependence, cigarettes, uncomplicated: Secondary | ICD-10-CM | POA: Insufficient documentation

## 2018-06-19 LAB — I-STAT BETA HCG BLOOD, ED (MC, WL, AP ONLY): I-stat hCG, quantitative: <5 m[IU]/mL (ref <5)

## 2018-06-19 LAB — COMPREHENSIVE METABOLIC PANEL
ALT: 15 U/L (ref 0–44)
AST: 16 U/L (ref 15–41)
Albumin: 4.1 g/dL (ref 3.5–5.0)
Alkaline Phosphatase: 44 U/L (ref 38–126)
Anion gap: 12 (ref 5–15)
BUN: 7 mg/dL (ref 6–20)
CO2: 22 mmol/L (ref 22–32)
Calcium: 9.4 mg/dL (ref 8.9–10.3)
Chloride: 98 mmol/L (ref 98–111)
Creatinine, Ser: 0.9 mg/dL (ref 0.44–1.00)
GFR calc Af Amer: >60 mL/min (ref >60)
GFR calc non Af Amer: >60 mL/min (ref >60)
Glucose, Bld: 95 mg/dL (ref 70–99)
Potassium: 3.5 mmol/L (ref 3.5–5.1)
Sodium: 132 mmol/L — ABNORMAL LOW (ref 135–145)
Total Bilirubin: 0.8 mg/dL (ref 0.3–1.2)
Total Protein: 8 g/dL (ref 6.5–8.1)

## 2018-06-19 LAB — LIPASE, BLOOD: Lipase: 22 U/L (ref 11–51)

## 2018-06-19 LAB — URINALYSIS, ROUTINE W REFLEX MICROSCOPIC
Bilirubin Urine: NEGATIVE
Glucose, UA: NEGATIVE mg/dL
Ketones, ur: 80 mg/dL — AB
Nitrite: POSITIVE — AB
Protein, ur: 30 mg/dL — AB
Specific Gravity, Urine: 1.017 (ref 1.005–1.030)
WBC, UA: >50 WBC/hpf — ABNORMAL HIGH (ref 0–5)
pH: 6 (ref 5.0–8.0)

## 2018-06-19 LAB — CBC
HCT: 41.8 % (ref 36.0–46.0)
Hemoglobin: 13.6 g/dL (ref 12.0–15.0)
MCH: 26.7 pg (ref 26.0–34.0)
MCHC: 32.5 g/dL (ref 30.0–36.0)
MCV: 82.1 fL (ref 80.0–100.0)
Platelets: 274 10*3/uL (ref 150–400)
RBC: 5.09 MIL/uL (ref 3.87–5.11)
RDW: 13.7 % (ref 11.5–15.5)
WBC: 11.7 10*3/uL — ABNORMAL HIGH (ref 4.0–10.5)
nRBC: 0 % (ref 0.0–0.2)

## 2018-06-19 MED ORDER — HYDROMORPHONE HCL 1 MG/ML IJ SOLN
1.0000 mg | Freq: Once | INTRAMUSCULAR | Status: AC
  Administered 2018-06-19: 1 mg via INTRAVENOUS
  Filled 2018-06-19: qty 1

## 2018-06-19 MED ORDER — SODIUM CHLORIDE 0.9 % IV SOLN
1.0000 g | Freq: Once | INTRAVENOUS | Status: AC
  Administered 2018-06-19: 1 g via INTRAVENOUS
  Filled 2018-06-19: qty 10

## 2018-06-19 MED ORDER — ONDANSETRON HCL 4 MG/2ML IJ SOLN
4.0000 mg | Freq: Once | INTRAMUSCULAR | Status: AC
  Administered 2018-06-19: 4 mg via INTRAVENOUS
  Filled 2018-06-19: qty 2

## 2018-06-19 MED ORDER — HYDROCODONE-ACETAMINOPHEN 5-325 MG PO TABS
2.0000 | ORAL_TABLET | Freq: Once | ORAL | Status: DC
  Filled 2018-06-19: qty 2

## 2018-06-19 MED ORDER — CEPHALEXIN 500 MG PO CAPS: 500.0000 mg | ORAL_CAPSULE | Freq: Four times a day (QID) | ORAL | 0 refills | Status: DC

## 2018-06-19 MED ORDER — SODIUM CHLORIDE 0.9 % IV BOLUS
1000.0000 mL | Freq: Once | INTRAVENOUS | Status: AC
  Administered 2018-06-19: 1000 mL via INTRAVENOUS

## 2018-06-19 MED ORDER — IOHEXOL 300 MG/ML  SOLN
100.0000 mL | Freq: Once | INTRAMUSCULAR | Status: AC | PRN
  Administered 2018-06-19: 100 mL via INTRAVENOUS

## 2018-06-19 MED ORDER — HYDROCODONE-ACETAMINOPHEN 5-325 MG PO TABS: 2.0000 | ORAL_TABLET | ORAL | 0 refills | Status: DC | PRN

## 2018-06-19 NOTE — ED Triage Notes (Signed)
Pt c/o ABD pain for 4 days. Pain is ULQ that radiates to the middle of her ABD. Pt endorses n/v.

## 2018-06-19 NOTE — ED Notes (Signed)
ED Provider at bedside. 

## 2018-06-19 NOTE — Discharge Instructions (Addendum)
Return if any problems.  Take medication as directed.  You need to see primary care and have a repeat ct scan in 2-3 months to make sure kidney returns to normal  If symptoms worsen return to the hospital for recheck

## 2018-06-19 NOTE — ED Provider Notes (Signed)
[facility] Provider Note   CSN: [id number] Arrival date & time: 06/19/18  1649     History   Chief Complaint Chief Complaint  Patient presents with  . Abdominal Pain    HPI Cindy Rodriguez is a 22 y.o. female.  The history is provided by the patient. No language interpreter was used.  Abdominal Pain   This is a new problem. Episode onset: 4 days. The problem occurs constantly. The problem has been gradually worsening. The pain is associated with an unknown factor. The pain is located in the LUQ. The pain is severe. Pertinent negatives include anorexia, diarrhea, vomiting and dysuria. Nothing aggravates the symptoms. Nothing relieves the symptoms. Past workup does not include GI consult. Her past medical history does not include gallstones or GERD.   Pt complains of severe pain left upper abdomen.   History reviewed. No pertinent past medical history.  There are no active problems to display for this patient.   History reviewed. No pertinent surgical history.   OB History   None      Home Medications    Prior to Admission medications   Medication Sig Start Date End Date Taking? Authorizing Provider  meloxicam (MOBIC) 15 MG tablet Take 1 tablet (15 mg total) by mouth daily. 04/24/18 04/24/19  Faythe GheeFisher, Susan W, PA-C    Family History No family history on file.  Social History Social History   Tobacco Use  . Smoking status: Current Every Day Smoker    Packs/day: 0.50    Types: Cigarettes  . Smokeless tobacco: Never Used  Substance Use Topics  . Alcohol use: No  . Drug use: No     Allergies   Patient has no known allergies.   Review of Systems Review of Systems  Gastrointestinal: Positive for abdominal pain. Negative for anorexia, diarrhea and vomiting.  Genitourinary: Negative for dysuria.  All other systems reviewed and are negative.    Physical Exam Updated Vital Signs BP 122/75 (BP Location: Right Arm)    Pulse 86   Temp (!) 100.9 F (38.3 C) (Oral)   Resp 20   Ht 5\' 1"  (1.549 m)   Wt 90.7 kg   LMP 05/20/2018   SpO2 99%   BMI 37.79 kg/m   Physical Exam  Constitutional: She is oriented to person, place, and time. She appears well-developed and well-nourished.  HENT:  Head: Normocephalic.  Eyes: EOM are normal.  Neck: Normal range of motion.  Cardiovascular: Normal rate.  Pulmonary/Chest: Effort normal.  Abdominal: Soft. Normal appearance and bowel sounds are normal. She exhibits no distension. There is tenderness in the left upper quadrant.  Musculoskeletal: Normal range of motion.  Neurological: She is alert and oriented to person, place, and time.  Skin: Skin is warm.  Psychiatric: She has a normal mood and affect.  Nursing note and vitals reviewed.    ED Treatments / Results  Labs (all labs ordered are listed, but only abnormal results are displayed) Labs Reviewed  COMPREHENSIVE METABOLIC PANEL - Abnormal; Notable for the following components:      Result Value   Sodium 132 (*)    All other components within normal limits  CBC - Abnormal; Notable for the following components:   WBC 11.7 (*)    All other components within normal limits  URINALYSIS, ROUTINE W REFLEX MICROSCOPIC - Abnormal; Notable for the following components:   APPearance HAZY (*)    Hgb urine dipstick MODERATE (*)    Ketones,  ur 80 (*)    Protein, ur 30 (*)    Nitrite POSITIVE (*)    Leukocytes, UA MODERATE (*)    WBC, UA >50 (*)    Bacteria, UA MANY (*)    All other components within normal limits  LIPASE, BLOOD  I-STAT BETA HCG BLOOD, ED ([facility], [facility], [facility] ONLY)  I-STAT BETA HCG BLOOD, ED ([facility], [facility], [facility] ONLY)    EKG None  Radiology Ct Abdomen Pelvis W Contrast  Result Date: 06/19/2018 CLINICAL DATA:  Lower abdominal pain, nausea and vomiting, and fever for 3 days. EXAM: CT ABDOMEN AND PELVIS WITH CONTRAST TECHNIQUE: Multidetector CT imaging of the abdomen and pelvis was performed using the  standard protocol following bolus administration of intravenous contrast. CONTRAST:  OMNIPAQUE IOHEXOL 300 MG/ML  SOLN COMPARISON:  None. FINDINGS: Lower Chest: No acute findings. Hepatobiliary: No hepatic masses identified. Gallbladder is unremarkable. Pancreas:  No mass or inflammatory changes. Spleen: Within normal limits in size and appearance. Adrenals/Urinary Tract: Normal appearance of adrenal glands and right kidney. An ill-defined area of decreased parenchymal enhancement is seen in the upper pole of the left kidney, with a similar but smaller area also seen in the anterior midpole the left kidney. These findings are suspicious for pyelonephritis. No evidence of renal abscess. No evidence of ureteral calculi or hydronephrosis. Unremarkable unopacified urinary bladder. Stomach/Bowel: No evidence of obstruction, inflammatory process or abnormal fluid collections. Vascular/Lymphatic: No pathologically enlarged lymph nodes. No abdominal aortic aneurysm. Reproductive:  No mass or other significant abnormality. Other:  None. Musculoskeletal:  No suspicious bone lesions identified. IMPRESSION: Ill-defined areas of decreased parenchymal enhancement in left kidney, highly suspicious for pyelonephritis. No evidence of renal abscess, ureteral calculi or hydronephrosis. Recommend follow-up with abdomen CT with contrast in 1-2 months to confirm resolution. Electronically Signed   By: Myles Rosenthal M.D.   On: 06/19/2018 19:13    Procedures Procedures (including critical care time)  Medications Ordered in ED Medications  HYDROmorphone (DILAUDID) injection 1 mg (1 mg Intravenous Given 06/19/18 1746)  ondansetron (ZOFRAN) injection 4 mg (4 mg Intravenous Given 06/19/18 1746)  iohexol (OMNIPAQUE) 300 MG/ML solution 100 mL (100 mLs Intravenous Contrast Given 06/19/18 1849)  cefTRIAXone (ROCEPHIN) 1 g in sodium chloride 0.9 % 100 mL IVPB (1 g Intravenous New Bag/Given 06/19/18 2031)  sodium chloride 0.9 % bolus  1,000 mL (1,000 mLs Intravenous New Bag/Given 06/19/18 2030)     Initial Impression / Assessment and Plan / ED Course  I have reviewed the triage vital signs and the nursing notes.  Pertinent labs & imaging results that were available during my care of the patient were reviewed by me and considered in my medical decision making (see chart for details).     MDM  Urine shows greater than 50 wbc's and many bacteria.  Ct scan shows possible pyelonephritis.  Pt given Iv fluids and Rocephin.  Pt eating and drinking with no vomiting.   Final Clinical Impressions(s) / ED Diagnoses   Final diagnoses:  Pyelonephritis    ED Discharge Orders         Ordered    HYDROcodone-acetaminophen (NORCO/VICODIN) 5-325 MG tablet  Every 4 hours PRN     06/19/18 2212    cephALEXin (KEFLEX) 500 MG capsule  4 times daily     06/19/18 2212        An After Visit Summary was printed and given to the patient.   Osie Cheeks 06/19/18 2212    Melene Plan,  DO 06/22/18 1505

## 2019-03-23 ENCOUNTER — Encounter: Payer: Self-pay | Admitting: Emergency Medicine

## 2019-03-23 ENCOUNTER — Emergency Department
Admission: EM | Admit: 2019-03-23 | Discharge: 2019-03-23 | Disposition: A | Discharge status: Home / Self Care | Payer: HRSA Program | Attending: Emergency Medicine | Admitting: Emergency Medicine

## 2019-03-23 ENCOUNTER — Other Ambulatory Visit: Payer: Self-pay

## 2019-03-23 ENCOUNTER — Emergency Department: Payer: HRSA Program

## 2019-03-23 DIAGNOSIS — F17210 Nicotine dependence, cigarettes, uncomplicated: Secondary | ICD-10-CM | POA: Insufficient documentation

## 2019-03-23 DIAGNOSIS — J069 Acute upper respiratory infection, unspecified: Secondary | ICD-10-CM | POA: Diagnosis not present

## 2019-03-23 DIAGNOSIS — U071 COVID-19: Secondary | ICD-10-CM | POA: Diagnosis not present

## 2019-03-23 DIAGNOSIS — R1110 Vomiting, unspecified: Secondary | ICD-10-CM | POA: Diagnosis present

## 2019-03-23 DIAGNOSIS — Z20822 Contact with and (suspected) exposure to covid-19: Secondary | ICD-10-CM

## 2019-03-23 LAB — URINALYSIS, COMPLETE (UACMP) WITH MICROSCOPIC
Bilirubin Urine: NEGATIVE
Glucose, UA: NEGATIVE mg/dL
Hgb urine dipstick: NEGATIVE
Ketones, ur: NEGATIVE mg/dL
Leukocytes,Ua: NEGATIVE
Nitrite: NEGATIVE
Protein, ur: NEGATIVE mg/dL
Specific Gravity, Urine: 1.028 (ref 1.005–1.030)
pH: 6 (ref 5.0–8.0)

## 2019-03-23 LAB — CBC
HCT: 37.1 % (ref 36.0–46.0)
Hemoglobin: 12.8 g/dL (ref 12.0–15.0)
MCH: 28.2 pg (ref 26.0–34.0)
MCHC: 34.5 g/dL (ref 30.0–36.0)
MCV: 81.7 fL (ref 80.0–100.0)
Platelets: 217 10*3/uL (ref 150–400)
RBC: 4.54 MIL/uL (ref 3.87–5.11)
RDW: 13.7 % (ref 11.5–15.5)
WBC: 4.1 10*3/uL (ref 4.0–10.5)
nRBC: 0 % (ref 0.0–0.2)

## 2019-03-23 LAB — COMPREHENSIVE METABOLIC PANEL WITH GFR
ALT: 14 U/L (ref 0–44)
AST: 15 U/L (ref 15–41)
Albumin: 3.8 g/dL (ref 3.5–5.0)
Alkaline Phosphatase: 36 U/L — ABNORMAL LOW (ref 38–126)
Anion gap: 9 (ref 5–15)
BUN: 11 mg/dL (ref 6–20)
CO2: 25 mmol/L (ref 22–32)
Calcium: 9.1 mg/dL (ref 8.9–10.3)
Chloride: 103 mmol/L (ref 98–111)
Creatinine, Ser: 0.63 mg/dL (ref 0.44–1.00)
GFR calc Af Amer: >60 mL/min (ref >60)
GFR calc non Af Amer: >60 mL/min (ref >60)
Glucose, Bld: 95 mg/dL (ref 70–99)
Potassium: 3.3 mmol/L — ABNORMAL LOW (ref 3.5–5.1)
Sodium: 137 mmol/L (ref 135–145)
Total Bilirubin: 0.6 mg/dL (ref 0.3–1.2)
Total Protein: 7.3 g/dL (ref 6.5–8.1)

## 2019-03-23 LAB — LIPASE, BLOOD: Lipase: 26 U/L (ref 11–51)

## 2019-03-23 MED ORDER — FAMOTIDINE 20 MG PO TABS
20.0000 mg | ORAL_TABLET | Freq: Once | ORAL | Status: AC
  Administered 2019-03-23: 20 mg via ORAL
  Filled 2019-03-23: qty 1

## 2019-03-23 MED ORDER — SODIUM CHLORIDE 0.9% FLUSH: 3.0000 mL | Freq: Once | INTRAVENOUS | Status: DC

## 2019-03-23 NOTE — ED Triage Notes (Signed)
Pt to ED via POV c/o loss of taste and smell. Pt states that she has also been vomiting. Pt states that she started vomiting blood today. Pt denies fever and chills. Pt states that she has had some cough but thinks this is due to her mouth being dry. Pt is in NAD at this time.

## 2019-03-23 NOTE — ED Provider Notes (Signed)
[facility] Emergency Department Provider Note   ____________________________________________   First MD Initiated Contact with Patient 03/23/19 1506     (approximate)  I have reviewed the triage vital signs and the nursing notes.   HISTORY  Chief Complaint loss of taste, loss of smell, and Emesis    HPI Cindy Rodriguez is a 23 y.o. female   reports no previous medical history  Comes today because she had an episode of vomiting, and that vomit she noticed small amounts of blood or clot.  She also associates that for the last week she has not been able to smell or taste anything.  She is vomited a few times.  She is has a slight dry cough and occasional chills.  She does tell me that she works at [employer] and 2 other employees there were taken out of work over the last couple weeks because they have coronavirus  Patient does report she was tested for corona virus about a week and a half ago that was negative, but at that point she did not have any symptoms she developed symptoms about 1 week ago.  Denies shortness of breath.  Reports she came because she was concerned when she threw up she saw some blood in it.  She has no chest pain no abdominal pain.  She has been taking ibuprofen off and on for the last few days, also occasionally using Tylenol over-the-counter  Denies pregnancy.  Lives with her 24-year-old child who has not had any symptoms of illness.  Patient reports she knows for fact she was around 2 people at work you tested positive  History reviewed. No pertinent past medical history.  There are no active problems to display for this patient.   History reviewed. No pertinent surgical history.  Prior to Admission medications   Medication Sig Start Date End Date Taking? Authorizing Provider  reports no previous medical history  Taking no medication                       Allergies Patient has no known allergies.  No family history on  file.  Social History Social History   Tobacco Use  . Smoking status: Current Every Day Smoker    Packs/day: 0.50    Types: Cigarettes  . Smokeless tobacco: Never Used  Substance Use Topics  . Alcohol use: No  . Drug use: No    Review of Systems Constitutional: No fever but some chills and slight fatigue Eyes: No visual changes. ENT: No sore throat.  Dryness in the back of her throat. Cardiovascular: Denies chest pain. Respiratory: Denies shortness of breath.  No occasional dry cough for the last 3 or 4 days Gastrointestinal: No abdominal pain.  Vomited a couple times over last few days, once today with some blood specks in it. Genitourinary: Negative for dysuria.  Denies pregnancy. Musculoskeletal: Negative for back pain.  Little bit of achiness in her muscles Skin: Negative for rash. Neurological: Negative for headaches, areas of focal weakness or numbness.    ____________________________________________   PHYSICAL EXAM:  VITAL SIGNS: ED Triage Vitals  Enc Vitals Group     BP 03/23/19 1302 (!) 118/95     Pulse Rate 03/23/19 1259 65     Resp 03/23/19 1259 16     Temp 03/23/19 1259 98.8 F (37.1 C)     Temp Source 03/23/19 1259 Oral     SpO2 03/23/19 1259 100 %  Weight --      Height --      Head Circumference --      Peak Flow --      Pain Score 03/23/19 1257 0     Pain Loc --      Pain Edu? --      Excl. in GC? --     Constitutional: Alert and oriented. Well appearing and in no acute distress. Eyes: Conjunctivae are normal. Head: Atraumatic. Nose: No congestion/rhinnorhea. Mouth/Throat: Mucous membranes are moist.  Posterior pharynx is slightly injected, tonsils are normal without hypertrophy or exudates bilateral.  No dried blood or active bleeding noted in the posterior nasopharynx or mouth. Neck: No stridor.  Cardiovascular: Normal rate, regular rhythm. Grossly normal heart sounds.  Good peripheral circulation. Respiratory: Normal respiratory  effort.  No retractions. Lungs CTAB.  Slight dry cough Gastrointestinal: Soft and nontender. No distention. Musculoskeletal: No lower extremity tenderness nor edema. Neurologic:  Normal speech and language. No gross focal neurologic deficits are appreciated.  Skin:  Skin is warm, dry and intact. No rash noted. Psychiatric: Mood and affect are normal. Speech and behavior are normal.  ____________________________________________   LABS (all labs ordered are listed, but only abnormal results are displayed)  Labs Reviewed  COMPREHENSIVE METABOLIC PANEL - Abnormal; Notable for the following components:      Result Value   Potassium 3.3 (*)    Alkaline Phosphatase 36 (*)    All other components within normal limits  URINALYSIS, COMPLETE (UACMP) WITH MICROSCOPIC - Abnormal; Notable for the following components:   Color, Urine YELLOW (*)    APPearance HAZY (*)    Bacteria, UA RARE (*)    All other components within normal limits  NOVEL CORONAVIRUS, NAA (HOSP ORDER, SEND-OUT TO REF LAB; TAT 18-24 HRS)  LIPASE, BLOOD  CBC  POC URINE PREG, ED   ____________________________________________  EKG   ____________________________________________  RADIOLOGY  Dg Chest Portable 1 View  Result Date: 03/23/2019 CLINICAL DATA:  Cough and vomiting. EXAM: PORTABLE CHEST 1 VIEW COMPARISON:  None. FINDINGS: The heart size and mediastinal contours are within normal limits. Both lungs are clear. The visualized skeletal structures are unremarkable. IMPRESSION: No active disease. Electronically Signed   By: Gerome Sam III M.D   On: 03/23/2019 15:48    ____________________________________________   PROCEDURES  Procedure(s) performed: None  Procedures  Critical Care performed: No  ____________________________________________   INITIAL IMPRESSION / ASSESSMENT AND PLAN / ED COURSE  Pertinent labs & imaging results that were available during my care of the patient were reviewed by me and  considered in my medical decision making (see chart for details).   Patient presents for evaluation of possible hematemesis.  Denies hemoptysis.  Recent illness however, and her symptoms seem very concerning for potential COVID-19.  I discussed with with the patient, also informed her of being out of work restrictions, precautions and treatment recommendations are on COVID-19 as I presume that she is positive though we do not yet have a confirmatory test.  She has very reassuring examination.  There is no evidence of large or massive hematemesis.  She denies any chest pain no abdominal pain, I did advise her to discontinue use of NSAIDs I suspect she may have a small gastric ulcer secondary to NSAID use or potentially slight Mallory-Weiss type tear.  Certainly no signs or symptoms suggest Borges, will obtain chest x-ray  She has no shortness of breath.  Normal vital signs.  No increased work of breathing.  No cardiac symptoms.  No black or bloody stools.    ----------------------------------------- 5:21 PM on 03/23/2019 -----------------------------------------  Chest x-ray clear.  No dyspnea.  Normal vital signs.  In the constellation of settings seem to suggest highly this is likely COVID-19 infection.  Will discharge the patient, reviewed return precautions, treatment recommendations, as well as return to work information with the patient.  Return precautions and treatment recommendations and follow-up discussed with the patient who is agreeable with the plan.  ____________________________________________   FINAL CLINICAL IMPRESSION(S) / ED DIAGNOSES  Final diagnoses:  Suspected Covid-19 Virus Infection  Viral upper respiratory illness        Note:  This document was prepared using Dragon voice recognition software and may include unintentional dictation errors       Delman Kitten, MD 03/23/19 1721

## 2019-03-23 NOTE — ED Notes (Addendum)
FIRST NURSE NOTE: Pt reports cough and vomiting, states she has coughed up blood, has loss of taste and smell, c/o mouth being swollen. Sxs have been going on x 1 week. No distress noted on arrival, pt ambulatory in lobby without difficulty.

## 2019-03-24 ENCOUNTER — Telehealth: Payer: Self-pay | Admitting: Emergency Medicine

## 2019-03-24 LAB — NOVEL CORONAVIRUS, NAA (HOSP ORDER, SEND-OUT TO REF LAB; TAT 18-24 HRS): SARS-CoV-2, NAA: DETECTED — AB

## 2019-03-24 NOTE — Telephone Encounter (Signed)
Called and gave pateint positive covid results.  Says she is pregnant and feels we should have kept her in the hospital.  I explained that she does not necessarily need admission due to pregnancy with covid.  She say she is going tounc today.

## 2019-03-27 ENCOUNTER — Emergency Department
Admission: EM | Admit: 2019-03-27 | Discharge: 2019-03-27 | Disposition: A | Discharge status: Home / Self Care | Payer: Medicaid Other | Attending: Student in an Organized Health Care Education/Training Program | Admitting: Student in an Organized Health Care Education/Training Program

## 2019-03-27 ENCOUNTER — Other Ambulatory Visit: Payer: Self-pay

## 2019-03-27 DIAGNOSIS — Z3201 Encounter for pregnancy test, result positive: Secondary | ICD-10-CM | POA: Diagnosis not present

## 2019-03-27 DIAGNOSIS — Z3491 Encounter for supervision of normal pregnancy, unspecified, first trimester: Secondary | ICD-10-CM

## 2019-03-27 DIAGNOSIS — R0602 Shortness of breath: Secondary | ICD-10-CM | POA: Diagnosis present

## 2019-03-27 DIAGNOSIS — F17210 Nicotine dependence, cigarettes, uncomplicated: Secondary | ICD-10-CM | POA: Insufficient documentation

## 2019-03-27 DIAGNOSIS — U071 COVID-19: Secondary | ICD-10-CM | POA: Diagnosis not present

## 2019-03-27 DIAGNOSIS — R0600 Dyspnea, unspecified: Secondary | ICD-10-CM

## 2019-03-27 HISTORY — DX: Dyspnea, unspecified: R06.00

## 2019-03-27 LAB — POCT PREGNANCY, URINE: Preg Test, Ur: POSITIVE — AB

## 2019-03-27 MED ORDER — LIDOCAINE VISCOUS HCL 2 % MT SOLN
15.0000 mL | Freq: Once | OROMUCOSAL | Status: AC
  Administered 2019-03-27: 15 mL via OROMUCOSAL
  Filled 2019-03-27: qty 15

## 2019-03-27 MED ORDER — LIDOCAINE VISCOUS HCL 2 % MT SOLN: 15.0000 mL | OROMUCOSAL | 0 refills | Status: DC | PRN

## 2019-03-27 NOTE — ED Triage Notes (Addendum)
Pt to the er for sob. Pt says she was dx with covid on the 30th. Pt states she is mad that she was sent home with a pregnancy test but she already knew she was pregnant and she should have been tested here. Pt has no fever and she took tylenol last night. Pt states "it's not working." VSS. Pt in no obvious distress. Pt c/o pain in her mouth and her left leg.

## 2019-03-27 NOTE — ED Provider Notes (Signed)
[facility] Emergency Department Provider Note  ____________________________________________  Time seen: Approximately 10:51 PM  I have reviewed the triage vital signs and the nursing notes.   HISTORY  Chief Complaint Shortness of Breath   HPI Cindy Rodriguez is a 23 y.o. female presenting to the emergency department for treatment of symptoms associated with COVID-19 as well as a pregnancy test.  Patient states that she was tested on the 30th and was called to be advised that her COVID test is positive.  Patient states that she does not think it is really positive.  She has been taking Tylenol with little to no relief.   She also states that she was sent home home with a urine pregnancy test cartridge.  She states that it was positive.  She states that she needs this test to be performed here for accuracy and documentation.    History reviewed. No pertinent past medical history.  There are no active problems to display for this patient.   History reviewed. No pertinent surgical history.  Prior to Admission medications   Medication Sig Start Date End Date Taking? Authorizing Provider  cephALEXin (KEFLEX) 500 MG capsule Take 1 capsule (500 mg total) by mouth 4 (four) times daily. 06/19/18   Fransico Meadow, PA-C  HYDROcodone-acetaminophen (NORCO/VICODIN) 5-325 MG tablet Take 2 tablets by mouth every 4 (four) hours as needed. 06/19/18   Fransico Meadow, PA-C  lidocaine (XYLOCAINE) 2 % solution Use as directed 15 mLs in the mouth or throat as needed for mouth pain. 03/27/19   Brytnee Bechler, Johnette Abraham B, FNP  meloxicam (MOBIC) 15 MG tablet Take 1 tablet (15 mg total) by mouth daily. 04/24/18 04/24/19  Versie Starks, PA-C    Allergies Patient has no known allergies.  No family history on file.  Social History Social History   Tobacco Use  . Smoking status: Current Every Day Smoker    Packs/day: 0.50    Types: Cigarettes  . Smokeless tobacco: Never Used  Substance  Use Topics  . Alcohol use: No  . Drug use: No    Review of Systems Constitutional: Negative for fever/chills.  Decreased appetite. ENT: Positive for sore throat.  Positive for mouth pain Cardiovascular: Denies chest pain. Respiratory: Positive for shortness of breath.  Positive for cough.  Negative for wheezing.  Gastrointestinal: Positive for nausea, no vomiting.  No diarrhea.  Musculoskeletal: Positive for body aches Skin: Negative for rash. Neurological: Positive for headaches ____________________________________________   PHYSICAL EXAM:  VITAL SIGNS: ED Triage Vitals  Enc Vitals Group     BP 03/27/19 1927 (!) 125/109     Pulse Rate 03/27/19 1927 78     Resp 03/27/19 1927 18     Temp 03/27/19 1927 98.6 F (37 C)     Temp Source 03/27/19 1927 Oral     SpO2 03/27/19 2045 97 %     Weight 03/27/19 1928 217 lb (98.4 kg)     Height 03/27/19 1928 5\' 1"  (1.549 m)     Head Circumference --      Peak Flow --      Pain Score 03/27/19 1927 10     Pain Loc --      Pain Edu? --      Excl. in [place]? --     Constitutional: Alert and oriented.  Overall well appearing and in no acute distress. Eyes: Conjunctivae are normal. Ears: TMs are normal Nose: Maxillary sinus congestion noted; no rhinnorhea. Mouth/Throat: Mucous membranes are  moist.  Oropharynx clear without erythema. Tonsils not visualized. Uvula midline. Neck: No stridor.  Lymphatic: No cervical lymphadenopathy. Cardiovascular: Normal rate, regular rhythm. Good peripheral circulation. Respiratory: Respirations are even and unlabored.  No retractions.  Breath sounds clear to auscultation throughout. Gastrointestinal: Soft and nontender.  Musculoskeletal: FROM x 4 extremities.  Neurologic:  Normal speech and language. Skin:  Skin is warm, dry and intact. No rash noted. Psychiatric: Mood and affect are normal. Speech and behavior are normal.  ____________________________________________   LABS (all labs ordered are  listed, but only abnormal results are displayed)  Labs Reviewed  POCT PREGNANCY, URINE - Abnormal; Notable for the following components:      Result Value   Preg Test, Ur POSITIVE (*)    All other components within normal limits  POC URINE PREG, ED   ____________________________________________  EKG  Not indicated ____________________________________________  RADIOLOGY  Not indicated ____________________________________________   PROCEDURES  Procedure(s) performed: None  Critical Care performed: No ____________________________________________   INITIAL IMPRESSION / ASSESSMENT AND PLAN / ED COURSE  23 y.o. female presenting to the emergency department for treatment of COVID-19 symptoms as well as to have a pregnancy test.  She will be treated symptomatically for COVID.  She was advised that she should take Tylenol if needed for body aches or fever.  She was advised that she could take Mucinex for her sinus congestion.  She was advised that she should call and schedule an appointment with the gynecologist of her choice.  She was also advised that she needed to start taking prenatal vitamins.  Patient is to return to the emergency department for symptoms of change or worsen if she is unable to schedule an appointment with primary care or the gynecologist.  Cindy Rodriguez was evaluated in Emergency Department on 03/27/2019 for the symptoms described in the history of present illness. She was evaluated in the context of the global COVID-19 pandemic, which necessitated consideration that the patient might be at risk for infection with the SARS-CoV-2 virus that causes COVID-19. Institutional protocols and algorithms that pertain to the evaluation of patients at risk for COVID-19 are in a state of rapid change based on information released by regulatory bodies including the CDC and federal and state organizations. These policies and algorithms were followed during the patient's care in the  ED.     Medications  lidocaine (XYLOCAINE) 2 % viscous mouth solution 15 mL (15 mLs Mouth/Throat Given 03/27/19 2148)    ED Discharge Orders         Ordered    lidocaine (XYLOCAINE) 2 % solution  As needed     03/27/19 2146           Pertinent labs & imaging results that were available during my care of the patient were reviewed by me and considered in my medical decision making (see chart for details).    If controlled substance prescribed during this visit, 12 month history viewed on the [facility] prior to issuing an initial prescription for Schedule II or III opiod. ____________________________________________   FINAL CLINICAL IMPRESSION(S) / ED DIAGNOSES  Final diagnoses:  First trimester pregnancy  COVID-19 virus detected    Note:  This document was prepared using Dragon voice recognition software and may include unintentional dictation errors.    Chinita Pesterriplett, Tracie Dore B, FNP 03/27/19 2256    Willy Eddyobinson, Patrick, MD 03/27/19 [phone]

## 2019-03-27 NOTE — Discharge Instructions (Signed)
Please begin taking prenatal vitamins. Call and schedule an appointment with gynecology to find out how far along you are and begin prenatal care.  For body aches and/or fever, you may take Tylenol extra strength, 2 tablets every 6 hours. You may also take plain Mucinex for the nasal congestion. For the pain in your mouth, rinse with the lidocaine for at least 30 seconds and then either swallow it or spit it out. It will make your throat feel numb if you swallow it.

## 2019-03-28 ENCOUNTER — Other Ambulatory Visit: Payer: Self-pay

## 2019-04-01 ENCOUNTER — Telehealth: Payer: Self-pay | Admitting: General Practice

## 2019-05-05 ENCOUNTER — Other Ambulatory Visit: Payer: Self-pay

## 2019-05-05 ENCOUNTER — Ambulatory Visit: Payer: Medicaid Other | Admitting: Family Medicine

## 2019-05-05 VITALS — BP 97/60 | Temp 98.8°F | Wt 216.4 lb

## 2019-05-05 DIAGNOSIS — Z8616 Personal history of COVID-19: Secondary | ICD-10-CM | POA: Insufficient documentation

## 2019-05-05 DIAGNOSIS — O99330 Smoking (tobacco) complicating pregnancy, unspecified trimester: Secondary | ICD-10-CM | POA: Insufficient documentation

## 2019-05-05 DIAGNOSIS — O98511 Other viral diseases complicating pregnancy, first trimester: Secondary | ICD-10-CM

## 2019-05-05 DIAGNOSIS — U071 COVID-19: Secondary | ICD-10-CM

## 2019-05-05 DIAGNOSIS — O99320 Drug use complicating pregnancy, unspecified trimester: Secondary | ICD-10-CM | POA: Insufficient documentation

## 2019-05-05 DIAGNOSIS — O99210 Obesity complicating pregnancy, unspecified trimester: Secondary | ICD-10-CM

## 2019-05-05 DIAGNOSIS — F1290 Cannabis use, unspecified, uncomplicated: Secondary | ICD-10-CM

## 2019-05-05 DIAGNOSIS — O09899 Supervision of other high risk pregnancies, unspecified trimester: Secondary | ICD-10-CM

## 2019-05-05 LAB — URINALYSIS
Bilirubin, UA: NEGATIVE
Glucose, UA: NEGATIVE
Ketones, UA: NEGATIVE
Leukocytes,UA: NEGATIVE
Nitrite, UA: NEGATIVE
Protein,UA: NEGATIVE
RBC, UA: NEGATIVE
Specific Gravity, UA: 1.025 (ref 1.005–1.030)
Urobilinogen, Ur: 1 mg/dL (ref 0.2–1.0)
pH, UA: 7 (ref 5.0–7.5)

## 2019-05-05 LAB — HEMOGLOBIN, FINGERSTICK: Hemoglobin: 12.7 g/dL (ref 11.1–15.9)

## 2019-05-05 LAB — HIV ANTIBODY (ROUTINE TESTING W REFLEX): HIV 1&2 Ab, 4th Generation: NONREACTIVE

## 2019-05-05 MED ORDER — ONDANSETRON 4 MG PO TBDP: 4.0000 mg | ORAL_TABLET | Freq: Four times a day (QID) | ORAL | 0 refills | Status: DC | PRN

## 2019-05-05 MED ORDER — ASPIRIN EC 81 MG PO TBEC: 81.0000 mg | DELAYED_RELEASE_TABLET | Freq: Every day | ORAL | 2 refills | Status: DC

## 2019-05-05 MED ORDER — PREPLUS 27-1 MG PO TABS: 1.0000 | ORAL_TABLET | Freq: Every day | ORAL | 13 refills | Status: DC

## 2019-05-05 NOTE — Progress Notes (Signed)
[place] [id number]  INITIAL PRENATAL VISIT NOTE  Subjective:  Cindy Rodriguez is a 23 y.o. G2P0010 at [redacted]w[redacted]d being seen today to start prenatal care at the [facility] Department.  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  Patient reports no complaints.  Contractions: Not present. Vag. Bleeding: None.  Movement: Absent. Denies leaking of fluid.   The following portions of the patient's history were reviewed and updated as appropriate: allergies, current medications, past family history, past medical history, past social history, past surgical history and problem list. Problem list updated.  Objective:   Vitals:   05/05/19 0908  BP: 97/60  Temp: 98.8 F (37.1 C)  Weight: 216 lb 6.4 oz (98.2 kg)    Fetal Status: Fetal Heart Rate (bpm): 150 Fundal Height: 14 cm Movement: Absent     Physical Exam Vitals signs and nursing note reviewed.  Constitutional:      General: She is not in acute distress.    Appearance: Normal appearance. She is well-developed.  HENT:     Head: Normocephalic and atraumatic.     Mouth/Throat:     Lips: Pink.     Mouth: Mucous membranes are moist.     Dentition: Normal dentition. No dental caries.  Neck:     Thyroid: No thyroid mass or thyromegaly.  Cardiovascular:     Rate and Rhythm: Normal rate.  Pulmonary:     Effort: Pulmonary effort is normal.     Breath sounds: Normal breath sounds.  Chest:     Breasts: Breasts are symmetrical.        Right: Normal. No mass, nipple discharge or skin change.        Left: Normal. No mass, nipple discharge or skin  change.  Abdominal:     General: Abdomen is flat.     Palpations: Abdomen is soft.     Tenderness: There is no abdominal tenderness.     Comments: Gravid, Obese  Genitourinary:    General: Normal vulva.     Exam position: Lithotomy position.     Pubic Area: No rash.      Labia:        Right: No rash.        Left: No rash.      Vagina: Normal. No vaginal discharge.     Cervix: No cervical motion tenderness or friability.     Uterus: Normal. Enlarged (Gravid 14 wk size). Not tender.      Adnexa: Right adnexa normal and left adnexa normal.     Rectum: Normal. No external hemorrhoid.  Lymphadenopathy:     Upper Body:     Right upper body: No axillary adenopathy.     Left upper body: No axillary adenopathy.  Skin:    General: Skin is warm.     Capillary Refill: Capillary refill takes less than 2 seconds.  Neurological:     Mental Status: She is alert.     Assessment and Plan:  Pregnancy: G2P0010 at [redacted]w[redacted]d  1. Supervision of other high risk pregnancy, antepartum  Discussed overview of care and coordination with inpatient delivery practices including [facility], [facility], [facility] and [facility].  Patient chose [facility] for  delivery location  Reviewed Centering pregnancy as standard of care at [facility], oriented to room and showed video. Based on EDD, plan for Cycle 75 . Informed Jeneen Montgomery- she will get patient set up for appt on Thursday - Hemoglobin, venipuncture - TSH - Lead, blood (adult age 82 yrs or greater) - Prenatal Profile I - Protein / creatinine ratio, urine  (Spot) - HIV [place] LAB - Chlamydia/Gonococcus/Trichomonas, NAA - Hgb A1c w/o eAG - HGB FRAC. W/SOLUBILITY - IGP, rfx Aptima HPV ASCU  2. COVID-19 affecting pregnancy RESOLVED Added to problem list for [facility] aware  3. Obesity affecting pregnancy, antepartum Reviewed ASA, agrees to start Discussed weight gain in pregnancy - TSH - Protein / creatinine ratio, urine  (Spot) - Hgb A1c w/o eAG -  Comprehensive metabolic panel  4. Marijuana use Last used in August 2020, was not a daily user - [phone] Drug Screen  5. 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 Used motivational interviewing to discuss barriers to tobacco cessation. Spent 3-10 minutes in direct counseling about ways to reduce use with the goal of eventual cessation. Reviewed health concerns for pregnancy and infant related to tobacco use.  Discussed gradual reduction and replacement of cigs with other activities.  Patient stated goal: quit smoking, has been reducing since finding out she was pregant     Preterm labor symptoms and general obstetric precautions including but not limited to vaginal bleeding, contractions, leaking of fluid and fetal movement were reviewed in detail with the patient.  Please refer to After Visit Summary for other counseling recommendations.   Return in about 3 days (around 05/08/2019) for Routine prenatal care, Centering Pregnancy Cycle 75.  No future appointments.  Federico Flake, MD

## 2019-05-05 NOTE — Progress Notes (Addendum)
Here today as an Initial Prenatal patient. Taking PNV QD, denies ED/hospital visits since +PT. Declines Flu vaccine. Hal Morales, RN Patient unable to tolerate 1hgtt today secondary to N/V. Dr Ernestina Patches informed. Hal Morales, RN

## 2019-05-06 LAB — PRENATAL PROFILE I(LABCORP)
Antibody Screen: NEGATIVE
Basophils Absolute: 0 10*3/uL (ref 0.0–0.2)
Basos: 0 %
EOS (ABSOLUTE): 0.2 10*3/uL (ref 0.0–0.4)
Eos: 3 %
Hematocrit: 36.2 % (ref 34.0–46.6)
Hemoglobin: 12.7 g/dL (ref 11.1–15.9)
Hepatitis B Surface Ag: NEGATIVE
Immature Grans (Abs): 0 10*3/uL (ref 0.0–0.1)
Immature Granulocytes: 0 %
Lymphocytes Absolute: 1.5 10*3/uL (ref 0.7–3.1)
Lymphs: 21 %
MCH: 28.7 pg (ref 26.6–33.0)
MCHC: 35.1 g/dL (ref 31.5–35.7)
MCV: 82 fL (ref 79–97)
Monocytes Absolute: 0.6 10*3/uL (ref 0.1–0.9)
Monocytes: 9 %
Neutrophils Absolute: 4.6 10*3/uL (ref 1.4–7.0)
Neutrophils: 67 %
Platelets: 278 10*3/uL (ref 150–450)
RBC: 4.42 x10E6/uL (ref 3.77–5.28)
RDW: 14.2 % (ref 11.7–15.4)
RPR Ser Ql: NONREACTIVE
Rh Factor: POSITIVE
Rubella Antibodies, IGG: 1.18 {index} (ref >0.99)
WBC: 6.8 10*3/uL (ref 3.4–10.8)

## 2019-05-06 LAB — COMPREHENSIVE METABOLIC PANEL
ALT: 12 IU/L (ref 0–32)
AST: 11 IU/L (ref 0–40)
Albumin/Globulin Ratio: 1.7 (ref 1.2–2.2)
Albumin: 4.5 g/dL (ref 3.9–5.0)
Alkaline Phosphatase: 40 IU/L (ref 39–117)
BUN/Creatinine Ratio: 14 (ref 9–23)
BUN: 8 mg/dL (ref 6–20)
Bilirubin Total: 0.3 mg/dL (ref 0.0–1.2)
CO2: 20 mmol/L (ref 20–29)
Calcium: 9.5 mg/dL (ref 8.7–10.2)
Chloride: 102 mmol/L (ref 96–106)
Creatinine, Ser: 0.59 mg/dL (ref 0.57–1.00)
GFR calc Af Amer: 148 mL/min/{1.73_m2} (ref >59)
GFR calc non Af Amer: 129 mL/min/{1.73_m2} (ref >59)
Globulin, Total: 2.7 g/dL (ref 1.5–4.5)
Glucose: 77 mg/dL (ref 65–99)
Potassium: 4.1 mmol/L (ref 3.5–5.2)
Sodium: 136 mmol/L (ref 134–144)
Total Protein: 7.2 g/dL (ref 6.0–8.5)

## 2019-05-06 LAB — TSH: TSH: 0.777 u[IU]/mL (ref 0.450–4.500)

## 2019-05-06 LAB — LEAD, BLOOD (ADULT >= 16 YRS): Lead-Whole Blood: <1 ug/dL (ref 0–4)

## 2019-05-06 LAB — HGB FRAC. W/SOLUBILITY
Hgb A2 Quant: 2 % (ref 1.8–3.2)
Hgb A: 98 % (ref 96.4–98.8)
Hgb C: 0 %
Hgb F Quant: 0 % (ref 0.0–2.0)
Hgb S: 0 %
Hgb Solubility: NEGATIVE
Hgb Variant: 0 %

## 2019-05-06 LAB — HGB A1C W/O EAG: Hgb A1c MFr Bld: 5.4 % (ref 4.8–5.6)

## 2019-05-07 LAB — URINE CULTURE

## 2019-05-08 ENCOUNTER — Ambulatory Visit: Payer: Medicaid Other

## 2019-05-08 DIAGNOSIS — O23592 Infection of other part of genital tract in pregnancy, second trimester: Secondary | ICD-10-CM | POA: Insufficient documentation

## 2019-05-08 DIAGNOSIS — A5901 Trichomonal vulvovaginitis: Secondary | ICD-10-CM | POA: Insufficient documentation

## 2019-05-08 LAB — CHLAMYDIA/GONOCOCCUS/TRICHOMONAS, NAA
Chlamydia by NAA: UNDETERMINED
Gonococcus by NAA: NEGATIVE
Trich vag by NAA: POSITIVE — AB

## 2019-05-09 ENCOUNTER — Telehealth: Payer: Self-pay

## 2019-05-09 ENCOUNTER — Other Ambulatory Visit: Payer: Self-pay

## 2019-05-09 ENCOUNTER — Telehealth: Payer: Self-pay | Admitting: Family Medicine

## 2019-05-09 ENCOUNTER — Other Ambulatory Visit: Payer: Medicaid Other

## 2019-05-09 DIAGNOSIS — A5901 Trichomonal vulvovaginitis: Secondary | ICD-10-CM

## 2019-05-09 DIAGNOSIS — O09899 Supervision of other high risk pregnancies, unspecified trimester: Secondary | ICD-10-CM

## 2019-05-09 DIAGNOSIS — O23592 Infection of other part of genital tract in pregnancy, second trimester: Secondary | ICD-10-CM

## 2019-05-09 LAB — IGP, RFX APTIMA HPV ASCU: PAP Smear Comment: 0

## 2019-05-09 MED ORDER — METRONIDAZOLE 500 MG PO TABS: 2000.0000 mg | ORAL_TABLET | Freq: Once | ORAL | 0 refills | Status: AC

## 2019-05-09 NOTE — Progress Notes (Signed)
In for Trich Tx per standing order Debera Lat, RN

## 2019-05-09 NOTE — Telephone Encounter (Signed)
Please call me

## 2019-05-09 NOTE — Telephone Encounter (Signed)
Call to pt.-confirmed identity-discussed +Trich and need for Tx; scheduled appt: 05/12/19 Cindy Lat, RN

## 2019-05-12 ENCOUNTER — Telehealth: Payer: Self-pay

## 2019-05-12 ENCOUNTER — Other Ambulatory Visit: Payer: Self-pay

## 2019-05-12 ENCOUNTER — Ambulatory Visit: Payer: Medicaid Other | Admitting: Family Medicine

## 2019-05-12 VITALS — BP 96/59 | Temp 98.4°F | Wt 211.2 lb

## 2019-05-12 DIAGNOSIS — O09899 Supervision of other high risk pregnancies, unspecified trimester: Secondary | ICD-10-CM

## 2019-05-12 DIAGNOSIS — O99210 Obesity complicating pregnancy, unspecified trimester: Secondary | ICD-10-CM

## 2019-05-12 DIAGNOSIS — O23592 Infection of other part of genital tract in pregnancy, second trimester: Secondary | ICD-10-CM

## 2019-05-12 DIAGNOSIS — O98511 Other viral diseases complicating pregnancy, first trimester: Secondary | ICD-10-CM

## 2019-05-12 DIAGNOSIS — U071 COVID-19: Secondary | ICD-10-CM

## 2019-05-12 DIAGNOSIS — O99330 Smoking (tobacco) complicating pregnancy, unspecified trimester: Secondary | ICD-10-CM

## 2019-05-12 DIAGNOSIS — A5901 Trichomonal vulvovaginitis: Secondary | ICD-10-CM

## 2019-05-12 MED ORDER — ONDANSETRON 4 MG PO TBDP: 4.0000 mg | ORAL_TABLET | Freq: Four times a day (QID) | ORAL | 0 refills | Status: DC | PRN

## 2019-05-12 MED ORDER — METRONIDAZOLE 500 MG PO TABS: ORAL_TABLET | ORAL | 0 refills | Status: DC

## 2019-05-12 MED ORDER — PREPLUS 27-1 MG PO TABS: 1.0000 | ORAL_TABLET | Freq: Every day | ORAL | 13 refills | Status: DC

## 2019-05-12 NOTE — Progress Notes (Addendum)
Here today for 15.2 MH Problem visit. Complains of vomiting 5x QD after STI treatment 05/09/2019. States able to keep water down but no solid foods. Denies ED/hospital visits since last visit. Hal Morales, RN

## 2019-05-12 NOTE — Progress Notes (Signed)
PRENATAL VISIT NOTE  Subjective:  Cindy Rodriguez is a 23 y.o. G2P0010 at [redacted]w[redacted]d being seen today for ongoing prenatal care.  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  Patient reports vomiting- unable to tolerate metronidazole treatment. Reports unable to "take them pills" and says "pills don't sit well with me" and this was even before pregnancy. She is having significant nausea related to pregnancy and was unable to take glucola at initial visit.  She was upset and wanted to make sure medications were safe for her baby.  Contractions: Not present. Vag. Bleeding: None.  Movement: Absent. Denies leaking of fluid/ROM.   The following portions of the patient's history were reviewed and updated as appropriate: allergies, current medications, past family history, past medical history, past social history, past surgical history and problem list. Problem list updated.  Objective:   Vitals:   05/12/19 1307  BP: (!) 96/59  Temp: 98.4 F (36.9 C)  Weight: 211 lb 3.2 oz (95.8 kg)    Fetal Status: Fetal Heart Rate (bpm): not heard Fundal Height: 14 cm Movement: Absent     General:  Alert, oriented and cooperative. Patient is in no acute distress.  Skin: Skin is warm and dry. No rash noted.   Cardiovascular: Normal heart rate noted  Respiratory: Normal respiratory effort, no problems with respiration noted  Abdomen: Soft, gravid, appropriate for gestational age.  Pain/Pressure: Absent     Pelvic: Cervical exam deferred        Extremities: Normal range of motion.  Edema: Trace  Mental Status: Normal mood and affect. Normal behavior. Normal judgment and thought content.    Assessment and Plan:  Pregnancy: G2P0010 at [redacted]w[redacted]d  1. Trichomonal vaginitis in pregnancy in second trimester Provided supportive motivational counseling Patient most worried about something hurting her baby. I reassured her that the reason we treat Trichomonas is that it is linked to preterm birth. Metronidazole is considered a pregnancy safe medication and the only treatment for trichomonas.  Patient unable to tolerate pill metornidazole. Called [facility] [place] to ask about oral suspension vs compounding. Referred to [facility] and I spoke to them over the phone to confirm their ability to compound.   Oral suspension called into [facility]-- plan for 71mL of 100mg /32mL oral suspension to treat Trich Recommended zofran ODT prior to taking suspension Patient agrees to plan   2. Supervision of other high risk pregnancy, antepartum Suspect anterior placenta, unable to hear [place]  today, last visit were 150s Discussed finding with patient and need for [place] to assure viability [place] ordered locally and RN scheduled for 10/26. - US OB Comp + 14 Wk; Future  3. COVID-19 affecting pregnancy in first trimester Resolved  4. Obesity affecting pregnancy, antepartum TWG=-8 lb 12.8 oz (-3.992 kg)   5. 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    Preterm labor symptoms and general obstetric precautions including but not limited to vaginal bleeding, contractions, leaking of fluid and fetal movement were reviewed in detail with the patient. Please refer to After Visit Summary for other counseling recommendations.  Return in about 4 weeks (around 06/09/2019) for Routine prenatal care, in person.  Future Appointments  Date Time Provider [place]  05/19/2019  11:00 AM [facility]-US [facility]-US OPIC-Outpati  06/02/2019 10:00 AM AC-MH PROVIDER AC-MAT None    Federico Flake, MD

## 2019-05-12 NOTE — Telephone Encounter (Signed)
See documentation of 05/12/2019 phone call. Patient also has an OV scheduled for 05/12/2019 @ 1:00. Hal Morales, RN

## 2019-05-12 NOTE — Telephone Encounter (Signed)
Patient called clinic this morning requesting to speak with "that nurse Debera Lat." This RN informed patient that Tye [place] is not in the agency today. Patient began using profanity stating that "we told her everything was ok when she was here but then I get a phone call from that nurse Debera Lat telling me I needed medicine and y'all know I can't take that and now I'm throwing up and can't even eat and I have a baby growing inside of me, y'all don't care nothing about me." RN informed patient that all test results are not readily available the same day as patient appts. RN counseled that if outside lab tests return positive that we will call patient and have patient come in for treatment. RN counseled that per the STI she has that this is the medicine that we normally treat with unless patient has an allergy to that same medicine. Patient continued using profanity "stating she knew I couldn't take that medicine and she gave it to me anyway." Per patient chart patient's only allergy is Mango Butter. Patient agreeable to come in and speak with provider today. Overbook appt scheduled for 1:00 with Dr. Ernestina Patches. Patient stated before hanging phone up "this will be my last appt there because y'all don't care anything about me." Hal Morales, RN

## 2019-05-15 LAB — 789231 7+OXYCODONE-BUND
Amphetamines, Urine: NEGATIVE ng/mL
BENZODIAZ UR QL: NEGATIVE ng/mL
Barbiturate screen, urine: NEGATIVE ng/mL
Cocaine (Metab.): NEGATIVE ng/mL
OPIATE SCREEN URINE: NEGATIVE ng/mL
Oxycodone/Oxymorphone, Urine: NEGATIVE ng/mL
PCP Quant, Ur: NEGATIVE ng/mL

## 2019-05-15 LAB — CANNABINOID CONFIRMATION, UR
CANNABINOIDS: POSITIVE — AB
Carboxy THC GC/MS Conf: >300 ng/mL

## 2019-05-15 LAB — PROTEIN / CREATININE RATIO, URINE
Creatinine, Urine: 159.4 mg/dL
Protein, Ur: 11.3 mg/dL
Protein/Creat Ratio: 71 mg/g{creat} (ref 0–200)

## 2019-05-16 ENCOUNTER — Telehealth: Payer: Self-pay | Admitting: Family Medicine

## 2019-05-16 NOTE — Telephone Encounter (Signed)
Returned call-reports was called by [facility] and U/S appt. Cancelled; call to scheduling at [facility] and reports needing prior authorization for u/s.  Discussed Pregnancy medical Home and not needing prior authorization.  Will consult with authorizing company and have call back to [facility] on Monday.  Client called back and informed of this and will f/u on 05/19/19 on u/s appt. Debera Lat, RN

## 2019-05-16 NOTE — Telephone Encounter (Signed)
pls call me  

## 2019-05-19 ENCOUNTER — Ambulatory Visit: Admission: RE | Admit: 2019-05-19 | Payer: Medicaid Other | Source: Ambulatory Visit

## 2019-05-23 ENCOUNTER — Ambulatory Visit
Admission: RE | Admit: 2019-05-23 | Discharge: 2019-05-23 | Disposition: A | Discharge status: Home / Self Care | Payer: Medicaid Other | Source: Ambulatory Visit | Attending: Family Medicine | Admitting: Family Medicine

## 2019-05-23 ENCOUNTER — Other Ambulatory Visit: Payer: Self-pay

## 2019-05-23 ENCOUNTER — Other Ambulatory Visit: Payer: Self-pay | Admitting: Family Medicine

## 2019-05-23 DIAGNOSIS — O09899 Supervision of other high risk pregnancies, unspecified trimester: Secondary | ICD-10-CM | POA: Diagnosis not present

## 2019-05-26 ENCOUNTER — Encounter: Payer: Self-pay | Admitting: Family Medicine

## 2019-05-26 DIAGNOSIS — U071 COVID-19: Secondary | ICD-10-CM

## 2019-05-26 DIAGNOSIS — O99330 Smoking (tobacco) complicating pregnancy, unspecified trimester: Secondary | ICD-10-CM

## 2019-05-26 DIAGNOSIS — O99210 Obesity complicating pregnancy, unspecified trimester: Secondary | ICD-10-CM

## 2019-05-26 DIAGNOSIS — O09899 Supervision of other high risk pregnancies, unspecified trimester: Secondary | ICD-10-CM

## 2019-05-26 DIAGNOSIS — A5901 Trichomonal vulvovaginitis: Secondary | ICD-10-CM

## 2019-05-26 DIAGNOSIS — O98511 Other viral diseases complicating pregnancy, first trimester: Secondary | ICD-10-CM

## 2019-05-26 NOTE — Progress Notes (Signed)
EDD changed based on [redacted]w[redacted]d [place]

## 2019-06-02 ENCOUNTER — Ambulatory Visit: Payer: Medicaid Other | Admitting: Family Medicine

## 2019-06-02 ENCOUNTER — Other Ambulatory Visit: Payer: Self-pay

## 2019-06-02 VITALS — BP 99/60 | Temp 97.7°F | Wt 216.0 lb

## 2019-06-02 DIAGNOSIS — A5901 Trichomonal vulvovaginitis: Secondary | ICD-10-CM

## 2019-06-02 DIAGNOSIS — O09899 Supervision of other high risk pregnancies, unspecified trimester: Secondary | ICD-10-CM

## 2019-06-02 DIAGNOSIS — O99210 Obesity complicating pregnancy, unspecified trimester: Secondary | ICD-10-CM

## 2019-06-02 DIAGNOSIS — U071 COVID-19: Secondary | ICD-10-CM

## 2019-06-02 DIAGNOSIS — O99330 Smoking (tobacco) complicating pregnancy, unspecified trimester: Secondary | ICD-10-CM

## 2019-06-02 DIAGNOSIS — O98511 Other viral diseases complicating pregnancy, first trimester: Secondary | ICD-10-CM

## 2019-06-02 DIAGNOSIS — O23592 Infection of other part of genital tract in pregnancy, second trimester: Secondary | ICD-10-CM

## 2019-06-02 NOTE — Progress Notes (Signed)
Patient here for MH RV at 48 2/7. States she went to pharmacy in [place] to pick up Metronidazole solution for treatment for Trich, PNV and zofran, and was told she had FPW and was to be charged $71.00 for the three medications. Patient did not pick up medications. Quad screen today.Jenetta Downer, RN

## 2019-06-02 NOTE — Progress Notes (Signed)
   PRENATAL VISIT NOTE  Subjective:  Cindy Rodriguez is a 23 y.o. G2P0010 at [redacted]w[redacted]d being seen today for ongoing prenatal care.  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  Patient reports no complaints.  Contractions: Not present. Vag. Bleeding: None.  Movement: Present. Denies leaking of fluid/ROM.   The following portions of the patient's history were reviewed and updated as appropriate: allergies, current medications, past family history, past medical history, past social history, past surgical history and problem list. Problem list updated.  Objective:   Vitals:   06/02/19 0936  BP: 99/60  Temp: 97.7 F (36.5 C)  Weight: 216 lb (98 kg)    Fetal Status: Fetal Heart Rate (bpm): 140 Fundal Height: 15 cm Movement: Present     General:  Alert, oriented and cooperative. Patient is in no acute distress.  Skin: Skin is warm and dry. No rash noted.   Cardiovascular: Normal heart rate noted  Respiratory: Normal respiratory effort, no problems with respiration noted  Abdomen: Soft, gravid, appropriate for gestational age.  Pain/Pressure: Absent     Pelvic: Cervical exam deferred        Extremities: Normal range of motion.  Edema: Trace  Mental Status: Normal mood and affect. Normal behavior. Normal judgment and thought content.   Assessment and Plan:  Pregnancy: G2P0010 at [redacted]w[redacted]d  1. Trichomonal vaginitis in pregnancy in second trimester Still needs treatment- RN and Janeece Riggers were working with [facility] to assure they are aware of her insurance coverage.    2. Supervision of other high risk pregnancy, antepartum Up to date Has anatomy US scheduled -  QUAD Screen [facility] Only  3. COVID-19 affecting pregnancy in first trimester LD aware, will still need routine screening on LD etc  4. Obesity affecting pregnancy, antepartum TWG=-4 lb (-1.814 kg)   5. 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 Please refer to After Visit Summary for other counseling recommendations.   Return in about 4 weeks (around 06/30/2019) for Routine prenatal care.  No future appointments.  Caren Macadam, MD

## 2019-06-06 ENCOUNTER — Telehealth: Payer: Self-pay

## 2019-06-06 NOTE — Telephone Encounter (Signed)
TC from [facility] re: QUAD screen drawn on 06/02/19. Patient results "increase risk for spinal bifida 1:52". This result is based on early u/s stating patient was 15.2 weeks on draw date.  Refer for anatomy scan and further f/u. Aileen Fass, RN

## 2019-06-09 ENCOUNTER — Telehealth: Payer: Self-pay | Admitting: Family Medicine

## 2019-06-09 DIAGNOSIS — O285 Abnormal chromosomal and genetic finding on antenatal screening of mother: Secondary | ICD-10-CM | POA: Insufficient documentation

## 2019-06-09 NOTE — Telephone Encounter (Signed)
Called patient - we received notification from [facility] on Friday about a positive QUAD result. I have reviewed the patient's dating and the result today and called the patient to discuss the result.    Discussed Positive AFP with patient. Patient is dated by [place] and thus does not need recalculating and this likely represents a true positive results.    We discussed the need for further testing.   Reviewed the basics of this being a screening test and not definitive for a diagnosis.   Reviewed recommendation to meet with Genetic Counseling on the same day as her already scheduled anatomy US on 11/18.   Caren Macadam, MD, MPH, ABFM Attending [place] for [facility]

## 2019-06-10 DIAGNOSIS — Z315 Encounter for genetic counseling: Secondary | ICD-10-CM | POA: Insufficient documentation

## 2019-06-10 DIAGNOSIS — O288 Other abnormal findings on antenatal screening of mother: Secondary | ICD-10-CM | POA: Insufficient documentation

## 2019-06-11 NOTE — Telephone Encounter (Signed)
Received this after I had already been informed while in clinic and I have called the patient. See Phone note.

## 2019-07-15 ENCOUNTER — Ambulatory Visit: Payer: Self-pay

## 2019-07-29 ENCOUNTER — Ambulatory Visit: Payer: Medicaid Other

## 2019-08-04 ENCOUNTER — Ambulatory Visit: Payer: Self-pay

## 2019-08-08 ENCOUNTER — Telehealth: Payer: Self-pay | Admitting: Family Medicine

## 2019-08-08 NOTE — Telephone Encounter (Signed)
NO ANSWER UNABLE TO LVM-NOT SET  UP

## 2019-08-13 NOTE — Telephone Encounter (Signed)
No answer, vm not set up.

## 2019-08-21 NOTE — Telephone Encounter (Signed)
Attempted to reschedule appt; no answer & cell # "unavailable"; left voicemail message at contact# Sharlette Dense, RN

## 2019-08-21 NOTE — Telephone Encounter (Signed)
Call returned by client's "Mother"-will give her the message Sharlette Dense, RN

## 2019-08-26 NOTE — Telephone Encounter (Signed)
Call to client's number and per recorded message, unavailable at this time. Call to emergency contact and per female, she is not there at this time. Requested he have client call [facility] at the [facility] as we are trying to get her an appt rescheduled in clinic. Jossie Ng, RN

## 2019-08-27 DIAGNOSIS — Z91199 Patient's noncompliance with other medical treatment and regimen due to unspecified reason: Secondary | ICD-10-CM | POA: Insufficient documentation

## 2019-08-27 DIAGNOSIS — O09892 Supervision of other high risk pregnancies, second trimester: Secondary | ICD-10-CM | POA: Insufficient documentation

## 2019-09-15 ENCOUNTER — Ambulatory Visit: Payer: Medicaid Other

## 2019-09-24 ENCOUNTER — Ambulatory Visit: Payer: Medicaid Other | Admitting: Advanced Practice Midwife

## 2019-09-24 ENCOUNTER — Other Ambulatory Visit: Payer: Self-pay

## 2019-09-24 VITALS — BP 95/59 | HR 74 | Temp 98.3°F | Wt 234.0 lb

## 2019-09-24 DIAGNOSIS — Z9119 Patient's noncompliance with other medical treatment and regimen: Secondary | ICD-10-CM

## 2019-09-24 DIAGNOSIS — U071 COVID-19: Secondary | ICD-10-CM

## 2019-09-24 DIAGNOSIS — O98511 Other viral diseases complicating pregnancy, first trimester: Secondary | ICD-10-CM

## 2019-09-24 DIAGNOSIS — Z23 Encounter for immunization: Secondary | ICD-10-CM | POA: Diagnosis not present

## 2019-09-24 DIAGNOSIS — O09899 Supervision of other high risk pregnancies, unspecified trimester: Secondary | ICD-10-CM | POA: Diagnosis not present

## 2019-09-24 DIAGNOSIS — O23592 Infection of other part of genital tract in pregnancy, second trimester: Secondary | ICD-10-CM

## 2019-09-24 DIAGNOSIS — O285 Abnormal chromosomal and genetic finding on antenatal screening of mother: Secondary | ICD-10-CM

## 2019-09-24 DIAGNOSIS — O09892 Supervision of other high risk pregnancies, second trimester: Secondary | ICD-10-CM

## 2019-09-24 DIAGNOSIS — Z91199 Patient's noncompliance with other medical treatment and regimen due to unspecified reason: Secondary | ICD-10-CM

## 2019-09-24 DIAGNOSIS — O99210 Obesity complicating pregnancy, unspecified trimester: Secondary | ICD-10-CM

## 2019-09-24 DIAGNOSIS — A5901 Trichomonal vulvovaginitis: Secondary | ICD-10-CM

## 2019-09-24 DIAGNOSIS — O99330 Smoking (tobacco) complicating pregnancy, unspecified trimester: Secondary | ICD-10-CM

## 2019-09-24 LAB — WET PREP FOR TRICH, YEAST, CLUE
Trichomonas Exam: NEGATIVE
Yeast Exam: NEGATIVE

## 2019-09-24 LAB — URINALYSIS
Bilirubin, UA: NEGATIVE
Glucose, UA: NEGATIVE
Ketones, UA: NEGATIVE
Nitrite, UA: NEGATIVE
Protein,UA: NEGATIVE
RBC, UA: NEGATIVE
Specific Gravity, UA: 1.02 (ref 1.005–1.030)
Urobilinogen, Ur: 1 mg/dL (ref 0.2–1.0)
pH, UA: 7 (ref 5.0–7.5)

## 2019-09-24 LAB — HEMOGLOBIN, FINGERSTICK: Hemoglobin: 11.2 g/dL (ref 11.1–15.9)

## 2019-09-24 LAB — HIV ANTIBODY (ROUTINE TESTING W REFLEX): HIV 1&2 Ab, 4th Generation: NONREACTIVE

## 2019-09-24 NOTE — Progress Notes (Signed)
   PRENATAL VISIT NOTE  Subjective:  Cindy Rodriguez is a 24 y.o. G2P0010 at [redacted]w[redacted]d being seen today for ongoing prenatal care.  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  Patient reports no complaints.  Contractions: Not present. Vag. Bleeding: None.  Movement: Present. Denies leaking of fluid/ROM.   The following portions of the patient's history were reviewed and updated as appropriate: allergies, current medications, past family history, past medical history, past social history, past surgical history and problem list. Problem list updated.  Objective:   Vitals:   09/24/19 1403  BP: (!) 95/59  Pulse: 74  Temp: 98.3 F (36.8 C)  Weight: 234 lb (106.1 kg)    Fetal Status: Fetal Heart Rate (bpm): 120 Fundal Height: 29 cm Movement: Present     General:  Alert, oriented and cooperative. Patient is in no acute distress.  Skin: Skin is warm and dry. No rash noted.   Cardiovascular: Normal heart rate noted  Respiratory: Normal respiratory effort, no problems with respiration noted  Abdomen: Soft, gravid, appropriate for gestational age.  Pain/Pressure: Absent     Pelvic: Cervical exam deferred        Extremities: Normal range of motion.  Edema: None  Mental Status: Normal mood and affect. Normal behavior. Normal judgment and thought content.   Assessment and Plan:  Pregnancy: G2P0010 at [redacted]w[redacted]d  1. 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  2. Trichomonal vaginitis in pregnancy in second trimester States last sex was before Christmas and partner was treated for trich TOC today  3. Supervision of other high risk pregnancy, antepartum Living with her mom.  No prenatal care x 16 wks. 1 hour glucola today Drank 1 glass of wine 06/21/19 "the doctor told me it was ok to drink wine when I'm pregnant"--counseled not to drink ETOH States not using MJ--agrees to UDS today Growth u/s ordered for increased AFP - Glucose, 1 hour gestational - RPR - HIV [facility] - Hemoglobin, venipuncture - [id number] Drug Screen - Urinalysis (Urine Dip) - WET PREP FOR TRICH, YEAST, CLUE  4. COVID-19 affecting pregnancy in first trimester   5. Obesity affecting pregnancy, antepartum Pt states didn't know anything about baby ASA and has not been taking  6. 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 Please refer to After Visit Summary for other counseling recommendations.  No follow-ups on file.  No future appointments.  Alberteen Spindle, CNM

## 2019-09-24 NOTE — Progress Notes (Signed)
In for visit; reports had anatomy u/s @ [facility], now desires [facility] delivery with [facility], needs new consents today; agrees to Tdap; undecided on Peds, resource list given; undecided on BCM, info. Given; reports did not take "drink" to treat Trich infection; needs gummy PNV Rx sent to pharmacy; Peggyann Juba, RPR, HIV today; CCNC & PHQ9 completed Sharlette Dense, RN  Wet prep reviewed-no Tx indicated; informed will call with u/s appt Sharlette Dense, RN

## 2019-09-25 ENCOUNTER — Telehealth: Payer: Self-pay

## 2019-09-25 LAB — GLUCOSE, 1 HOUR GESTATIONAL: Gestational Diabetes Screen: 92 mg/dL (ref 65–139)

## 2019-09-25 LAB — RPR: RPR Ser Ql: NONREACTIVE

## 2019-09-25 NOTE — Telephone Encounter (Signed)
TC to patient to inform of [facility] U/S appointment at 1:00pm. Patient told to arrive (alone) 12:45 and call front desk from parking area. Patient states agreement and understanding.[name], RN

## 2019-09-27 LAB — CHLAMYDIA/GC NAA, CONFIRMATION
Chlamydia trachomatis, NAA: NEGATIVE
Neisseria gonorrhoeae, NAA: NEGATIVE

## 2019-09-30 ENCOUNTER — Other Ambulatory Visit: Payer: Self-pay | Admitting: Obstetrics & Gynecology

## 2019-09-30 ENCOUNTER — Other Ambulatory Visit: Payer: Medicaid Other

## 2019-09-30 DIAGNOSIS — R772 Abnormality of alphafetoprotein: Secondary | ICD-10-CM

## 2019-09-30 LAB — 789231 7+OXYCODONE-BUND
Amphetamines, Urine: NEGATIVE ng/mL
BENZODIAZ UR QL: NEGATIVE ng/mL
Barbiturate screen, urine: NEGATIVE ng/mL
Cocaine (Metab.): NEGATIVE ng/mL
OPIATE SCREEN URINE: NEGATIVE ng/mL
Oxycodone/Oxymorphone, Urine: NEGATIVE ng/mL
PCP Quant, Ur: NEGATIVE ng/mL

## 2019-09-30 LAB — CANNABINOID CONFIRMATION, UR
CANNABINOIDS: POSITIVE — AB
Carboxy THC GC/MS Conf: >300 ng/mL

## 2019-10-01 ENCOUNTER — Other Ambulatory Visit: Payer: Self-pay

## 2019-10-01 ENCOUNTER — Ambulatory Visit (INDEPENDENT_AMBULATORY_CARE_PROVIDER_SITE_OTHER): Payer: Medicaid Other

## 2019-10-01 ENCOUNTER — Other Ambulatory Visit: Payer: Self-pay | Admitting: Obstetrics and Gynecology

## 2019-10-01 DIAGNOSIS — O365930 Maternal care for other known or suspected poor fetal growth, third trimester, not applicable or unspecified: Secondary | ICD-10-CM

## 2019-10-01 DIAGNOSIS — Z3A32 32 weeks gestation of pregnancy: Secondary | ICD-10-CM

## 2019-10-01 DIAGNOSIS — Z362 Encounter for other antenatal screening follow-up: Secondary | ICD-10-CM | POA: Diagnosis not present

## 2019-10-01 DIAGNOSIS — R772 Abnormality of alphafetoprotein: Secondary | ICD-10-CM | POA: Diagnosis not present

## 2019-10-01 NOTE — Progress Notes (Signed)
HIV results abstracted. Iriana Artley, RN  

## 2019-10-03 ENCOUNTER — Ambulatory Visit: Payer: Medicaid Other

## 2019-10-08 ENCOUNTER — Ambulatory Visit: Payer: Medicaid Other

## 2019-10-13 ENCOUNTER — Telehealth: Payer: Self-pay | Admitting: General Practice

## 2019-10-13 NOTE — Telephone Encounter (Signed)
scheduled patient for follow up maternity 3/29 (earliest avaliable) patient would like to be seen sooner. I advised her I would put in a telephone note.

## 2019-10-13 NOTE — Telephone Encounter (Signed)
Attempted to return call-left message that we can work in for afternoon 10/16/19-to return call Sharlette Dense, RN

## 2019-10-20 ENCOUNTER — Ambulatory Visit: Payer: Medicaid Other | Admitting: Family Medicine

## 2019-10-20 ENCOUNTER — Encounter: Payer: Self-pay | Admitting: Family Medicine

## 2019-10-20 ENCOUNTER — Other Ambulatory Visit: Payer: Self-pay

## 2019-10-20 VITALS — BP 100/62 | HR 80 | Temp 96.9°F | Wt 244.0 lb

## 2019-10-20 DIAGNOSIS — F1290 Cannabis use, unspecified, uncomplicated: Secondary | ICD-10-CM

## 2019-10-20 DIAGNOSIS — O365990 Maternal care for other known or suspected poor fetal growth, unspecified trimester, not applicable or unspecified: Secondary | ICD-10-CM

## 2019-10-20 DIAGNOSIS — A5901 Trichomonal vulvovaginitis: Secondary | ICD-10-CM

## 2019-10-20 DIAGNOSIS — O285 Abnormal chromosomal and genetic finding on antenatal screening of mother: Secondary | ICD-10-CM

## 2019-10-20 DIAGNOSIS — O09892 Supervision of other high risk pregnancies, second trimester: Secondary | ICD-10-CM

## 2019-10-20 DIAGNOSIS — O99330 Smoking (tobacco) complicating pregnancy, unspecified trimester: Secondary | ICD-10-CM

## 2019-10-20 DIAGNOSIS — O23592 Infection of other part of genital tract in pregnancy, second trimester: Secondary | ICD-10-CM

## 2019-10-20 DIAGNOSIS — Z91199 Patient's noncompliance with other medical treatment and regimen due to unspecified reason: Secondary | ICD-10-CM

## 2019-10-20 DIAGNOSIS — O09899 Supervision of other high risk pregnancies, unspecified trimester: Secondary | ICD-10-CM

## 2019-10-20 DIAGNOSIS — O99210 Obesity complicating pregnancy, unspecified trimester: Secondary | ICD-10-CM

## 2019-10-20 MED ORDER — PREPLUS 27-1 MG PO TABS: ORAL_TABLET | ORAL | 3 refills | Status: DC

## 2019-10-20 NOTE — Progress Notes (Signed)
Patient here for MH RV at 35 2/7. Needs PNV prescription. Has questions about 09/30/2019 U/S.Marland KitchenBurt Knack, RN

## 2019-10-20 NOTE — Progress Notes (Signed)
[facility] referral faxed with confirmation.[name], RN

## 2019-10-20 NOTE — Progress Notes (Signed)
PRENATAL VISIT NOTE  Subjective:  Cindy Rodriguez is a 24 y.o. G2P0010 at 46w2dbeing seen today for ongoing prenatal care.  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  Patient reports no complaints.  Contractions: Not present. Vag. Bleeding: None.  Movement: Present. Denies leaking of fluid/ROM.   The following portions of the patient's history were reviewed and updated as appropriate: allergies, current medications, past family history, past medical history, past social history, past surgical history and problem list. Problem list updated.  Objective:   Vitals:   10/20/19 0857  BP: 100/62  Pulse: 80  Temp: (!) 96.9 F (36.1 C)  Weight: 244 lb (110.7 kg)    Fetal Status: Fetal Heart Rate (bpm): 130 Fundal Height: 35 cm Movement: Present  Presentation: Vertex  General:  Alert, oriented and cooperative. Patient is in no acute distress.  Skin: Skin is warm and dry. No rash noted.   Cardiovascular: Normal heart rate noted  Respiratory: Normal respiratory effort, no problems with respiration noted  Abdomen: Soft, gravid, appropriate for gestational age.  Pain/Pressure: Absent     Pelvic: Cervical exam deferred        Extremities: Normal range of motion.  Edema: Trace  Mental Status: Normal mood and affect. Normal behavior. Normal judgment and thought content.   Assessment and Plan:  Pregnancy: G2P0010 at [redacted]w[redacted]d 1. Supervision of other high risk pregnancy,  antepartum -Pt met with OBCM RaKieth Brightlyoday.  - Prenatal Vit-Fe Fumarate-FA (PREPLUS) 27-1 MG TABS; Take 1 tablet daily while pregnancy or breastfeeding.  Dispense: 100 tablet; Refill: 3  2. Pregnancy affected by fetal growth restriction -Growth 12.6% but AC 2.9% by 3/10 [place]iscuss[place] with Dr. NeErnestina PatchesRef[facility] and [facility].   3. Abnormal chromosomal and genetic finding on antenatal screening mother -She had [place]/10 for growth d/t incr AFP and [facility] to see mention of anatomy that was previously suboptimally viewed. I [facility] and if not performed requesting another [place] 3. Obesity affecting pregnancy, antepartum -TWG = 24 lb (10.9 kg) -Above goal for weight gain, states she was eating more d/t learning that her baby was small. As she was not underweight we discussed it is healthiest for her baby to stay w/in recommended wt gain of 11-20 lbs.  -FH appropriate today. No excessive edema.  -Encouraged daily exercise of walking, limit liquid calories. -She has not been taking aspirin.   4. Trichomonal vaginitis in pregnancy in second trimester -TOC last visit negative.   5. 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  6. Marijuana use -States last use was in 2020.  7. Noncompliant pregnant patient in second trimester -Discussed importance of q wk appts until delivery for mom/baby health.      Preterm labor symptoms and general obstetric precautions including but not limited to vaginal bleeding, contractions, leaking of fluid and fetal movement were reviewed in detail with the patient. Please refer to After Visit  Summary for other counseling recommendations.  Return in about 1 week (around 10/27/2019) for routine prenatal care.  Future Appointments  Date Time Provider [place]  10/27/2019  8:40 AM [facility]  PROVIDER AC-MAT None    Kandee Keen, PA-C

## 2019-10-27 ENCOUNTER — Other Ambulatory Visit: Payer: Self-pay | Admitting: Obstetrics and Gynecology

## 2019-10-27 ENCOUNTER — Other Ambulatory Visit: Payer: Self-pay

## 2019-10-27 ENCOUNTER — Ambulatory Visit: Payer: Medicaid Other | Admitting: Family Medicine

## 2019-10-27 ENCOUNTER — Ambulatory Visit: Payer: Medicaid Other

## 2019-10-27 ENCOUNTER — Encounter: Payer: Self-pay | Admitting: Family Medicine

## 2019-10-27 ENCOUNTER — Ambulatory Visit (INDEPENDENT_AMBULATORY_CARE_PROVIDER_SITE_OTHER): Payer: Medicaid Other

## 2019-10-27 VITALS — BP 91/57 | HR 63 | Temp 97.3°F | Wt 246.2 lb

## 2019-10-27 DIAGNOSIS — Z3689 Encounter for other specified antenatal screening: Secondary | ICD-10-CM

## 2019-10-27 DIAGNOSIS — O09892 Supervision of other high risk pregnancies, second trimester: Secondary | ICD-10-CM

## 2019-10-27 DIAGNOSIS — O365930 Maternal care for other known or suspected poor fetal growth, third trimester, not applicable or unspecified: Secondary | ICD-10-CM

## 2019-10-27 DIAGNOSIS — A5901 Trichomonal vulvovaginitis: Secondary | ICD-10-CM

## 2019-10-27 DIAGNOSIS — O30009 Twin pregnancy, unspecified number of placenta and unspecified number of amniotic sacs, unspecified trimester: Secondary | ICD-10-CM

## 2019-10-27 DIAGNOSIS — O99330 Smoking (tobacco) complicating pregnancy, unspecified trimester: Secondary | ICD-10-CM

## 2019-10-27 DIAGNOSIS — Z362 Encounter for other antenatal screening follow-up: Secondary | ICD-10-CM

## 2019-10-27 DIAGNOSIS — O26843 Uterine size-date discrepancy, third trimester: Secondary | ICD-10-CM | POA: Diagnosis not present

## 2019-10-27 DIAGNOSIS — Z8616 Personal history of COVID-19: Secondary | ICD-10-CM

## 2019-10-27 DIAGNOSIS — O09899 Supervision of other high risk pregnancies, unspecified trimester: Secondary | ICD-10-CM

## 2019-10-27 DIAGNOSIS — Z3A32 32 weeks gestation of pregnancy: Secondary | ICD-10-CM

## 2019-10-27 DIAGNOSIS — Z9119 Patient's noncompliance with other medical treatment and regimen: Secondary | ICD-10-CM

## 2019-10-27 DIAGNOSIS — F1290 Cannabis use, unspecified, uncomplicated: Secondary | ICD-10-CM

## 2019-10-27 DIAGNOSIS — O99210 Obesity complicating pregnancy, unspecified trimester: Secondary | ICD-10-CM

## 2019-10-27 DIAGNOSIS — Z91199 Patient's noncompliance with other medical treatment and regimen due to unspecified reason: Secondary | ICD-10-CM

## 2019-10-27 DIAGNOSIS — O23592 Infection of other part of genital tract in pregnancy, second trimester: Secondary | ICD-10-CM

## 2019-10-27 DIAGNOSIS — O4690 Antepartum hemorrhage, unspecified, unspecified trimester: Secondary | ICD-10-CM

## 2019-10-27 MED ORDER — PRENATAL GUMMIES/DHA & FA 0.4-32.5 MG PO CHEW: 1.0000 | CHEWABLE_TABLET | Freq: Every day | ORAL | 11 refills | Status: DC

## 2019-10-27 NOTE — Progress Notes (Addendum)
Here today for 36.2 week MH RV. Not taking PNV "I was wanting to talk to someone about taking Gummies." Denies Ed/hospital visits since last RV. Patient states "I feel weak today and I've had some spotting twice through the night. " 36 week labs today. Has scheduled [facility] [place] today at 11:00. Tawny Hopping, RN:

## 2019-10-27 NOTE — Progress Notes (Signed)
PRENATAL VISIT NOTE  Subjective:  Cindy Rodriguez is a 24 y.o. G2P0010 at [redacted]w[redacted]d being seen today for ongoing prenatal care.  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  Patient reports "feeling weak today".  Contractions: Not present. Vag. Bleeding: None.  Movement: Present. Denies leaking of fluid/ROM.   The following portions of the patient's history were reviewed and updated as appropriate: allergies, current medications, past family history, past medical history, past social history, past surgical history and problem list. Problem list updated.  Objective:   Vitals:   10/27/19 0850  BP: (!) 91/57  Pulse: 63  Temp: (!) 97.3 F (36.3 C)  Weight: 246 lb 3.2 oz (111.7 kg)    Fetal Status: Fetal Heart Rate (bpm): 140 Fundal Height: 35 cm Movement: Present  Presentation: Vertex  General:  Alert, oriented and cooperative. Patient is in no acute distress.  Skin: Skin is warm and dry. No rash noted.   Cardiovascular: Normal heart rate noted  Respiratory: Normal respiratory effort, no problems with respiration noted  Abdomen: Soft, gravid, appropriate for gestational age.  Pain/Pressure: Absent     Pelvic: Cervical exam deferred        Extremities: Normal range of motion.  Edema: Trace  Mental Status: Normal mood and affect. Normal behavior. Normal judgment and thought content.   Assessment and Plan:  Pregnancy: G2P0010 at [redacted]w[redacted]d  1. Noncompliant pregnant  patient in second trimester Since returning to care continues to skip appoiintment Has OBCM Ellison Carwin involved in care and plans to see patient today  2. Trichomonal vaginitis in pregnancy in second trimester TOC negative  3. Supervision of other high risk pregnancy, antepartum Suspected fetal growth restriction (EFW 12% but AC <3rd%). Has [place] [facility] to assess fetal growth and have delivery planning visit-- anticipated with patient that they may recommend IOL between 38-39 wks. Reviewed risk of stillbirth with growth restricted babies. Reviewed that [place] is to exam the growth curve of the baby.  Sent in prenatal gummy vitamin to pharmacy.  - GBS collected - GC/CT collected   4. COVID-19 affecting pregnancy in first trimester Resolved  5. Obesity affecting pregnancy, antepartum TWG=26 lb 3.2 oz (11.9 kg) which is above goal for her weight  6. 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 Quit in Nov reports people smoke around her Also of note, room smells of marijuana and patient denies use but does report people use MJ regularly around her.   7. Vaginal bleeding - blood when wiping 2x last night, no bleeding on speculum exam. Noted hemorrhoid on exam - Patient to monitor sx and report to hospital if continued  Preterm labor symptoms and general obstetric precautions including but not limited to vaginal bleeding, contractions, leaking of fluid and fetal movement were reviewed in detail with the patient. Please refer to After Visit Summary for other counseling recommendations.   Return in about 1 week (around 11/03/2019) for Routine prenatal care, in person.  Future Appointments  Date Time Provider Department Center  10/27/2019 11:00 AM WS-[facility] [place] 2 WS-IMG None  Caren Macadam, MD

## 2019-10-29 LAB — CHLAMYDIA/GC NAA, CONFIRMATION
Chlamydia trachomatis, NAA: NEGATIVE
Neisseria gonorrhoeae, NAA: NEGATIVE

## 2019-10-30 ENCOUNTER — Encounter: Payer: Self-pay | Admitting: Obstetrics and Gynecology

## 2019-10-30 ENCOUNTER — Observation Stay
Admission: EM | Admit: 2019-10-30 | Discharge: 2019-10-30 | Disposition: A | Discharge status: Home / Self Care | Payer: Medicaid Other | Attending: Obstetrics and Gynecology | Admitting: Obstetrics and Gynecology

## 2019-10-30 DIAGNOSIS — O365931 Maternal care for other known or suspected poor fetal growth, third trimester, fetus 1: Secondary | ICD-10-CM | POA: Diagnosis not present

## 2019-10-30 DIAGNOSIS — O0933 Supervision of pregnancy with insufficient antenatal care, third trimester: Secondary | ICD-10-CM | POA: Insufficient documentation

## 2019-10-30 DIAGNOSIS — Z8616 Personal history of COVID-19: Secondary | ICD-10-CM | POA: Diagnosis not present

## 2019-10-30 DIAGNOSIS — O99330 Smoking (tobacco) complicating pregnancy, unspecified trimester: Secondary | ICD-10-CM

## 2019-10-30 DIAGNOSIS — Z3A36 36 weeks gestation of pregnancy: Secondary | ICD-10-CM | POA: Insufficient documentation

## 2019-10-30 DIAGNOSIS — O09892 Supervision of other high risk pregnancies, second trimester: Secondary | ICD-10-CM

## 2019-10-30 DIAGNOSIS — O99210 Obesity complicating pregnancy, unspecified trimester: Secondary | ICD-10-CM

## 2019-10-30 DIAGNOSIS — O365930 Maternal care for other known or suspected poor fetal growth, third trimester, not applicable or unspecified: Secondary | ICD-10-CM | POA: Diagnosis not present

## 2019-10-30 DIAGNOSIS — A5901 Trichomonal vulvovaginitis: Secondary | ICD-10-CM

## 2019-10-30 DIAGNOSIS — O99213 Obesity complicating pregnancy, third trimester: Secondary | ICD-10-CM | POA: Diagnosis not present

## 2019-10-30 DIAGNOSIS — Z87891 Personal history of nicotine dependence: Secondary | ICD-10-CM | POA: Insufficient documentation

## 2019-10-30 DIAGNOSIS — O09899 Supervision of other high risk pregnancies, unspecified trimester: Secondary | ICD-10-CM

## 2019-10-30 DIAGNOSIS — Z91199 Patient's noncompliance with other medical treatment and regimen due to unspecified reason: Secondary | ICD-10-CM

## 2019-10-30 DIAGNOSIS — O99333 Smoking (tobacco) complicating pregnancy, third trimester: Secondary | ICD-10-CM | POA: Diagnosis not present

## 2019-10-30 LAB — SARS CORONAVIRUS 2 (TAT 6-24 HRS): SARS Coronavirus 2: NEGATIVE

## 2019-10-30 MED ORDER — BETAMETHASONE SOD PHOS & ACET 6 (3-3) MG/ML IJ SUSP
INTRAMUSCULAR | Status: AC
  Administered 2019-10-30: 12 mg via INTRAMUSCULAR
  Filled 2019-10-30: qty 5

## 2019-10-30 MED ORDER — BETAMETHASONE SOD PHOS & ACET 6 (3-3) MG/ML IJ SUSP: 12.0000 mg | Freq: Once | INTRAMUSCULAR | Status: AC

## 2019-10-30 NOTE — H&P (Signed)
Cindy Rodriguez is an 24 y.o. female.   Chief Complaint: IUGR HPI: She presents today for triage for NST.  She reports normal fetal movement.  She denies any issues with bleeding vaginal discharge or contractions.  She has questions regarding her plan for induction.  She is otherwise feeling well and has no complaints.  Her her pregnancy has been complicated by severe IUGR.  Her baby is less than 2.3 percentile growth overall. UA dopplers have been normal.   Pregnancy 2020 Problems (from 05/05/19 to present)    Problem Noted Resolved   Noncompliant pregnant patient no prenatal care x 16 wks 08/27/2019 by Debera Lat, RN No   Overview Addendum 09/03/2019  4:00 PM by Debera Lat, RN    No response to calls; missed appointment letter mailed regular & certified mail 09/22/33=> Card returned signed by "ALP"         Trichomonal vaginitis in pregnancy in second trimester 05/08/2019 by Debera Lat, RN No   Overview Addendum 10/20/2019 10:29 AM by Kandee Keen, PA-C    Treated 05/09/19 with Metronidazole 2gm po; partner referral [x]  Retest 09/24/19: negative      Supervision of other high risk pregnancy, antepartum 05/05/2019 by Caren Macadam, MD No   Overview Addendum 10/27/2019 11:35 AM by Caren Macadam, MD     Nursing Staff Provider  Office Location  [facility] Dating   [redacted]w[redacted]d [place]  Language  English Anatomy US   WNL but inadequate heart/face views 06/11/19 @ 16 4/7 wk;  f/u 07/09/19 also wnl but w/some inadequate views,  [ ]  needs f/u in 4 wks  Flu Vaccine  Declined 05/05/19 Genetic Screen     Quad: POS (AFP 1:52)   TDaP vaccine   09/24/2019 Hgb A1C or  GTT Early  Third trimester = 92 (WNL)  Rhogam   NA   LAB RESULTS   Feeding Plan  Bottle Blood Type O/Positive/-- (10/12 1030)   Contraception Condoms Antibody Negative (10/12 1030)  Circumcision  Rubella 1.18 (10/12 1030)/Immune  Pediatrician  [facility] RPR Non Reactive (10/12 1030)   Support Person  HBsAg  Negative (10/12 1030)   Prenatal Classes  HIV Non reactive (10/12 0000)    Varicella @varicellaresultconsole @   BTL Consent  GBS  (For PCN allergy, check sensitivities)        VBAC Consent  Pap      Hgb Electro    BP Cuff ordered  CF   Delivery Group  [facility]   Centering Group            Obesity affecting pregnancy, antepartum BMI=44.2 05/05/2019 by Caren Macadam, MD No   Overview Addendum 10/20/2019 10:30 AM by Kandee Keen, PA-C    Recommendations [x]  Aspirin 81 mg daily after 12 weeks; - pt never started (09/24/19) [x]  Nutrition consult- has WIC [x]  Weight gain 11-20 lbs for singleton and 25-35 lbs for twin pregnancy (IOM guidelines) . Higher class of obesity patients recommended to gain closer to lower limit  . Weight loss is associated with adverse outcomes [ ]  Baseline and surveillance labs (pulled in from [facility], refresh links as needed)  Lab Results  Component Value Date   PLT 217 03/23/2019   CREATININE 0.63 03/23/2019   AST 15 03/23/2019   ALT 14 03/23/2019    Antenatal Testing: Not indicated.  [ ]  Growth scans every 4-6 weeks as needed (fundal height likely inadequate in morbidly obese patients)  Postpartum Care: [ ]  Consider prophylactic wound  vac/PICO for C/S [ ]  Lovenox for DVT/PE prophylaxis (6 hours after vaginal delivery, 12 hours after C/S).    Lovenox 40 mg [place] q24h (BMI 30.0-39.9 kg/m2)   Lovenox 0.5 mg/kg [place] q12h ((BMI ?40 kg/m2 ); Max 150 mg [place] q12h.   Consider prolonged therapy x 6 weeks PP in very concerning patients (I.e morbid obesity with other co-morbidities that increase risk of DVT/PE) [ ]  Counsel about diet, exercise and weight loss. Referrals PRN.  ICD10 Codes: O99.210   Obesity in pregnancy (BMI 30.0-39.9 kg/m2)  O99.210, E66.01 Maternal Morbid Obesity (BMI ?40 kg/m2 ).**Have to use both codes, this is a HCC code and risk adjusts/more reimbursement**  Obesity is defined as body mass index (BMI) ?30 kg/m2 .  Class I (BMI 30.0  to 34.9 kg/m2) . Class II (BMI 35.0 to 39.9 kg/m2) . 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 Dyspnea 03/27/2019   + Covid-19 (03/23/2019)    History reviewed. No pertinent surgical history.  Family History  Problem Relation Age of Onset  . Hypertension Mother   . Diabetes Mother   . Asthma Mother   . Asthma Sister   . Thyroid disease Maternal Grandmother   . Seizures Maternal Grandmother   . Cancer Maternal Grandmother   . Asthma Maternal Grandmother   . Drug abuse Maternal Grandfather   . Cancer Maternal Grandfather   . Sickle cell trait Maternal Grandfather   . Hypertension Paternal Grandmother   . Cirrhosis Paternal Grandmother   . Hypertension Paternal Grandfather   . Drug abuse Paternal Grandfather   . Heart disease Paternal Grandfather   . Cirrhosis Paternal Grandfather    Social History:  reports that she has quit smoking. Her smoking use included cigarettes. She smoked 0.50 packs per day. She has never used smokeless tobacco. She reports previous alcohol use. She reports previous drug use.  Allergies:  Allergies  Allergen Reactions  . Mango Butter Itching    Mangos    Medications Prior to Admission  Medication Sig Dispense Refill  . aspirin EC 81 MG tablet Take 1 tablet (81 mg total) by mouth daily. Take after 12 weeks for prevention of preeclampsia later in pregnancy (Patient not taking: Reported on 05/12/2019) 300 tablet 2  . metroNIDAZOLE (FLAGYL) 500 MG tablet Take two tablets by mouth twice a day, for one day.  Or you can take all four tablets at once if you can tolerate it. [facility] to Compound.  Suspension 100mg /mL (Patient not taking: Reported on 10/20/2019) 4 tablet 0  . ondansetron (ZOFRAN ODT) 4 MG  disintegrating tablet Take 1 tablet (4 mg total) by mouth every 6 (six) hours as needed for nausea (Take prior to sugar test). (Patient not taking: Reported on 10/20/2019) 10 tablet 0  . Prenatal MV-Min-FA-Omega-3 (PRENATAL GUMMIES/DHA & FA) 0.4-32.5 MG CHEW Chew 1 tablet by mouth daily. 30 tablet 11  . Prenatal Vit-Fe Fumarate-FA (PREPLUS) 27-1 MG TABS Take 1 tablet daily while pregnancy or breastfeeding. (Patient not taking: Reported on 10/27/2019) 100 tablet 3    No results found for this or any previous visit (from the past 48 hour(s)). No results found.  Review of Systems  Constitutional: Negative  for chills and fever.  HENT: Negative for congestion, hearing loss and sinus pain.   Respiratory: Negative for cough, shortness of breath and wheezing.   Cardiovascular: Negative for chest pain, palpitations and leg swelling.  Gastrointestinal: Negative for abdominal pain, constipation, diarrhea, nausea and vomiting.  Genitourinary: Negative for dysuria, flank pain, frequency, hematuria and urgency.  Musculoskeletal: Negative for back pain.  Skin: Negative for rash.  Neurological: Negative for dizziness and headaches.  Psychiatric/Behavioral: Negative for suicidal ideas. The patient is not nervous/anxious.     Blood pressure 105/63, pulse 71, temperature 98.5 F (36.9 C), temperature source Oral, height 5\' 1"  (1.549 m), weight 110.7 kg, last menstrual period 01/25/2019. Physical Exam  Nursing note and vitals reviewed. Constitutional: She is oriented to person, place, and time. She appears well-developed and well-nourished.  HENT:  Head: Normocephalic and atraumatic.  Cardiovascular: Normal rate and regular rhythm.  Respiratory: Effort normal and breath sounds normal.  GI: Soft. Bowel sounds are normal.  Musculoskeletal:        General: Normal range of motion.  Neurological: She is alert and oriented to person, place, and time.  Skin: Skin is warm and dry.  Psychiatric: She has a normal  mood and affect. Her behavior is normal. Judgment and thought content normal.    NST: 120 bpm baseline, moderate variability, 15x15 accelerations, no decelerations. Tocometer; irregular  Assessment/Plan 24 year old G2, P0 at 36 weeks and 5 days. Pregnancy complicated by severe IUGR less than the 3rd percentile.  Plan for care for the patient is for her to have betamethasone today and tomorrow with anticipation of induction of labor at 37 weeks on Saturday morning.  Patient has been scheduled for an 8 AM induction.  Discussed the induction process with the patient in detail including administration of Cytotec and Pitocin.  Discussed her options for pain management during labor as well as expectations for meals during labor.  All of the patient's questions were answered.  She will return tomorrow to labor and delivery for her second dose of betamethasone and a repeat NST.  Covid testing was performed today.  GBS swab is pending.  More than 30 minutes were spent face to face with the patient in the room, reviewing the medical record, labs and images, and coordinating care for the patient. The plan of management was discussed in detail and counseling was provided.    Sunday, MD 10/30/2019, 3:05 PM

## 2019-10-30 NOTE — OB Triage Note (Signed)
Pt presents for her first dose of BMZ. +FM. Denies LOF, bleeding, CTX, discharge. Pt scheduled for IOL this Saturday. Pt covid swabbed while here.

## 2019-10-30 NOTE — Progress Notes (Signed)
Pt given discharge instructions to come back for second dose of BMZ and NST tomorrow at 1430. Pt given labor precautions. Verbalized understanding.

## 2019-10-30 NOTE — Discharge Summary (Signed)
Physician Discharge Summary   Patient ID: Cindy Rodriguez [id number] 24 y.o. 17-Sep-1995  Admit date: 10/30/2019  Discharge date and time: No discharge date for patient encounter.   Admitting Physician: Natale Milch, MD   Discharge Physician: Adelene Idler MD  Admission Diagnoses: IUGR (intrauterine growth restriction) affecting care of mother, third trimester, fetus 1 [O36.5931]  Discharge Diagnoses: same as above  Admission Condition: good  Discharged Condition: good  Indication for Admission: See H&P  Hospital Course: See H&P  Consults: None  Significant Diagnostic Studies: labs: covid pending  Treatments: betamethasone  Discharge Exam: BP 105/63 (BP Location: Left Arm)   Pulse 71   Temp 98.5 F (36.9 C) (Oral)   Ht 5\' 1"  (1.549 m)   Wt 110.7 kg   LMP 01/25/2019   BMI 46.10 kg/m   General Appearance:    Alert, cooperative, no distress, appears stated age  Head:    Normocephalic, without obvious abnormality, atraumatic  Eyes:    PERRL, conjunctiva/corneas clear, EOM's intact, fundi    benign, both eyes  Ears:    Normal TM's and external ear canals, both ears  Nose:   Nares normal, septum midline, mucosa normal, no drainage    or sinus tenderness  Throat:   Lips, mucosa, and tongue normal; teeth and gums normal  Neck:   Supple, symmetrical, trachea midline, no adenopathy;    thyroid:  no enlargement/tenderness/nodules; no carotid   bruit or JVD  Back:     Symmetric, no curvature, ROM normal, no CVA tenderness  Lungs:     Clear to auscultation bilaterally, respirations unlabored  Chest Wall:    No tenderness or deformity   Heart:    Regular rate and rhythm, S1 and S2 normal, no murmur, rub   or gallop  Breast Exam:    No tenderness, masses, or nipple abnormality  Abdomen:     Soft, non-tender, bowel sounds active all four quadrants,    no masses, no organomegaly  Genitalia:    Normal female without lesion, discharge or tenderness  Rectal:     Normal tone, normal prostate, no masses or tenderness;   guaiac negative stool  Extremities:   Extremities normal, atraumatic, no cyanosis or edema  Pulses:   2+ and symmetric all extremities  Skin:   Skin color, texture, turgor normal, no rashes or lesions  Lymph nodes:   Cervical, supraclavicular, and axillary nodes normal  Neurologic:   CNII-XII intact, normal strength, sensation and reflexes    throughout    Disposition: Discharge disposition: 01-Home or Self Care       Patient Instructions:  Allergies as of 10/30/2019      Reactions   Mango Butter Itching   Mangos      Medication List    STOP taking these medications   metroNIDAZOLE 500 MG tablet Commonly known as: FLAGYL   ondansetron 4 MG disintegrating tablet Commonly known as: Zofran ODT   PrePLUS 27-1 MG Tabs     TAKE these medications   aspirin EC 81 MG tablet Take 1 tablet (81 mg total) by mouth daily. Take after 12 weeks for prevention of preeclampsia later in pregnancy   Prenatal Gummies/DHA & FA 0.4-32.5 MG Chew Chew 1 tablet by mouth daily.      Activity: activity as tolerated Diet: regular diet Wound Care: none needed  Follow-up with L&D in 1 day.  Signed: 07-28-2000 10/30/2019 3:32 PM

## 2019-10-30 NOTE — Discharge Instructions (Signed)
Vaginal Delivery  Vaginal delivery means that you give birth by pushing your baby out of your birth canal (vagina). A team of health care providers will help you before, during, and after vaginal delivery. Birth experiences are unique for every woman and every pregnancy, and birth experiences vary depending on where you choose to give birth. What happens when I arrive at the birth center or hospital? Once you are in labor and have been admitted into the hospital or birth center, your health care provider may:  Review your pregnancy history and any concerns that you have.  Insert an IV into one of your veins. This may be used to give you fluids and medicines.  Check your blood pressure, pulse, temperature, and heart rate (vital signs).  Check whether your bag of water (amniotic sac) has broken (ruptured).  Talk with you about your birth plan and discuss pain control options. Monitoring Your health care provider may monitor your contractions (uterine monitoring) and your baby's heart rate (fetal monitoring). You may need to be monitored:  Often, but not continuously (intermittently).  All the time or for long periods at a time (continuously). Continuous monitoring may be needed if: ? You are taking certain medicines, such as medicine to relieve pain or make your contractions stronger. ? You have pregnancy or labor complications. Monitoring may be done by:  Placing a special stethoscope or a handheld monitoring device on your abdomen to check your baby's heartbeat and to check for contractions.  Placing monitors on your abdomen (external monitors) to record your baby's heartbeat and the frequency and length of contractions.  Placing monitors inside your uterus through your vagina (internal monitors) to record your baby's heartbeat and the frequency, length, and strength of your contractions. Depending on the type of monitor, it may remain in your uterus or on your baby's head until  birth.  Telemetry. This is a type of continuous monitoring that can be done with external or internal monitors. Instead of having to stay in bed, you are able to move around during telemetry. Physical exam Your health care provider may perform frequent physical exams. This may include:  Checking how and where your baby is positioned in your uterus.  Checking your cervix to determine: ? Whether it is thinning out (effacing). ? Whether it is opening up (dilating). What happens during labor and delivery?  Normal labor and delivery is divided into the following three stages: Stage 1  This is the longest stage of labor.  This stage can last for hours or days.  Throughout this stage, you will feel contractions. Contractions generally feel mild, infrequent, and irregular at first. They get stronger, more frequent (about every 2-3 minutes), and more regular as you move through this stage.  This stage ends when your cervix is completely dilated to 4 inches (10 cm) and completely effaced. Stage 2  This stage starts once your cervix is completely effaced and dilated and lasts until the delivery of your baby.  This stage may last from 20 minutes to 2 hours.  This is the stage where you will feel an urge to push your baby out of your vagina.  You may feel stretching and burning pain, especially when the widest part of your baby's head passes through the vaginal opening (crowning).  Once your baby is delivered, the umbilical cord will be clamped and cut. This usually occurs after waiting a period of 1-2 minutes after delivery.  Your baby will be placed on your bare chest (  skin-to-skin contact) in an upright position and covered with a warm blanket. Watch your baby for feeding cues, like rooting or sucking, and help the baby to your breast for his or her first feeding. Stage 3  This stage starts immediately after the birth of your baby and ends after you deliver the placenta.  This stage may  take anywhere from 5 to 30 minutes.  After your baby has been delivered, you will feel contractions as your body expels the placenta and your uterus contracts to control bleeding. What can I expect after labor and delivery?  After labor is over, you and your baby will be monitored closely until you are ready to go home to ensure that you are both healthy. Your health care team will teach you how to care for yourself and your baby.  You and your baby will stay in the same room (rooming in) during your hospital stay. This will encourage early bonding and successful breastfeeding.  You may continue to receive fluids and medicines through an IV.  Your uterus will be checked and massaged regularly (fundal massage).  You will have some soreness and pain in your abdomen, vagina, and the area of skin between your vaginal opening and your anus (perineum).  If an incision was made near your vagina (episiotomy) or if you had some vaginal tearing during delivery, cold compresses may be placed on your episiotomy or your tear. This helps to reduce pain and swelling.  You may be given a squirt bottle to use instead of wiping when you go to the bathroom. To use the squirt bottle, follow these steps: ? Before you urinate, fill the squirt bottle with warm water. Do not use hot water. ? After you urinate, while you are sitting on the toilet, use the squirt bottle to rinse the area around your urethra and vaginal opening. This rinses away any urine and blood. ? Fill the squirt bottle with clean water every time you use the bathroom.  It is normal to have vaginal bleeding after delivery. Wear a sanitary pad for vaginal bleeding and discharge. Summary  Vaginal delivery means that you will give birth by pushing your baby out of your birth canal (vagina).  Your health care provider may monitor your contractions (uterine monitoring) and your baby's heart rate (fetal monitoring).  Your health care provider may  perform a physical exam.  Normal labor and delivery is divided into three stages.  After labor is over, you and your baby will be monitored closely until you are ready to go home. This information is not intended to replace advice given to you by your health care provider. Make sure you discuss any questions you have with your health care provider. Document Revised: 08/14/2017 Document Reviewed: 08/14/2017 Elsevier Patient Education  2020 [employer]  Pain Relief During Labor and Delivery Many things can cause pain during labor and delivery, including:  Pressure on bones and ligaments due to the baby moving through the pelvis.  Stretching of tissues due to the baby moving through the birth canal.  Muscle tension due to anxiety or nervousness.  The uterus tightening (contracting) and relaxing to help move the baby. There are many ways to deal with the pain of labor and delivery. They include:  Taking prenatal classes. Taking these classes helps you know what to expect during your baby's birth. What you learn will increase your confidence and decrease your anxiety.  Practicing relaxation techniques or doing relaxing activities, such as: ? Focused breathing. ?   Meditation. ? Visualization. ? Aroma therapy. ? Listening to your favorite music. ? Hypnosis.  Taking a warm shower or bath (hydrotherapy). This may: ? Provide comfort and relaxation. ? Lessen your perception of pain. ? Decrease the amount of pain medicine needed. ? Decrease the length of labor.  Getting a massage or counterpressure on your back.  Applying warm packs or ice packs.  Changing positions often, moving around, or using a birthing ball.  Getting: ? Pain medicine through an IV or injection into a muscle. ? Pain medicine inserted into your spinal column. ? Injections of sterile water just under the skin on your lower back (intradermal injections). ? Laughing gas (nitrous oxide). Discuss your pain control  options with your health care provider during your prenatal visits. Explore the options offered by your hospital or birth center. What kinds of medicine are available? There are two kinds of medicines that can be used to relieve pain during labor and delivery:  Analgesics. These medicines decrease pain without causing you to lose feeling or the ability to move your muscles.  Anesthetics. These medicines block feeling in the body and can decrease your ability to move freely. Both of these kinds of medicine can cause minor side effects, such as nausea, trouble concentrating, and sleepiness. They can also decrease the baby's heart rate before birth and affect the baby's breathing rate after birth. For this reason, health care providers are careful about when and how much medicine is given. What are specific medicines and procedures that provide pain relief? Local Anesthetics Local anesthetics are used to numb a small area of the body. They may be used along with another kind of anesthetic or used to numb the nerves of the vagina, cervix, and perineum during the second stage of labor. General Anesthetics General anesthetics cause you to lose consciousness so you do not feel pain. They are usually only used for an emergency cesarean delivery. General anesthetics are given through an IV tube and a mask. Pudendal Block A pudendal block is a form of local anesthetic. It may be used to relieve the pain associated with pushing or stretching of the perineum at the time of delivery or to further numb the perineum. A pudendal block is done by injecting numbing medicine through the vaginal wall into a nerve in the pelvis. Epidural Analgesia Epidural analgesia is given through a flexible IV catheter that is inserted into the lower back. Numbing medicine is delivered continuously to the area near your spinal column nerves (epidural space). After having this type of analgesia, you may be able to move your legs but  you most likely will not be able to walk. Depending on the amount of medicine given, you may lose all feeling in the lower half of your body, or you may retain some level of sensation, including the urge to push. Epidural analgesia can be used to provide pain relief for a vaginal birth. Spinal Block A spinal block is similar to epidural analgesia, but the medicine is injected into the spinal fluid instead of the epidural space. A spinal block is only given once. It starts to relieve pain quickly, but the pain relief lasts only 1-6 hours. Spinal blocks can be used for cesarean deliveries. Combined Spinal-Epidural (CSE) Block A CSE block combines the effects of a spinal block and epidural analgesia. The spinal block works quickly to block all pain. The epidural analgesia provides continuous pain relief, even after the effects of the spinal block have worn off. This information   This information is not intended to replace advice given to you by your health care provider. Make sure you discuss any questions you have with your health care provider. Document Revised: 06/22/2017 Document Reviewed: 12/01/2015 Elsevier Patient Education  2020 [employer]   Labor Induction  Labor induction is when steps are taken to cause a pregnant woman to begin the labor process. Most women go into labor on their own between 37 weeks and 42 weeks of pregnancy. When this does not happen or when there is a medical need for labor to begin, steps may be taken to induce labor. Labor induction causes a pregnant woman's uterus to contract. It also causes the cervix to soften (ripen), open (dilate), and thin out (efface). Usually, labor is not induced before 39 weeks of pregnancy unless there is a medical reason to do so. Your health care provider will determine if labor induction is needed. Before inducing labor, your health care provider will consider a number of factors, including:  Your medical condition and your baby's.  How many  weeks along you are in your pregnancy.  How mature your baby's lungs are.  The condition of your cervix.  The position of your baby.  The size of your birth canal. What are some reasons for labor induction? Labor may be induced if:  Your health or your baby's health is at risk.  Your pregnancy is overdue by 1 week or more.  Your water breaks but labor does not start on its own.  There is a low amount of amniotic fluid around your baby. You may also choose (elect) to have labor induced at a certain time. Generally, elective labor induction is done no earlier than 39 weeks of pregnancy. What methods are used for labor induction? Methods used for labor induction include:  Prostaglandin medicine. This medicine starts contractions and causes the cervix to dilate and ripen. It can be taken by mouth (orally) or by being inserted into the vagina (suppository).  Inserting a small, thin tube (catheter) with a balloon into the vagina and then expanding the balloon with water to dilate the cervix.  Stripping the membranes. In this method, your health care provider gently separates amniotic sac tissue from the cervix. This causes the cervix to stretch, which in turn causes the release of a hormone called progesterone. The hormone causes the uterus to contract. This procedure is often done during an office visit, after which you will be sent home to wait for contractions to begin.  Breaking the water. In this method, your health care provider uses a small instrument to make a small hole in the amniotic sac. This eventually causes the amniotic sac to break. Contractions should begin after a few hours.  Medicine to trigger or strengthen contractions. This medicine is given through an IV that is inserted into a vein in your arm. Except for membrane stripping, which can be done in a clinic, labor induction is done in the hospital so that you and your baby can be carefully monitored. How long does it  take for labor to be induced? The length of time it takes to induce labor depends on how ready your body is for labor. Some inductions can take up to 2-3 days, while others may take less than a day. Induction may take longer if:  You are induced early in your pregnancy.  It is your first pregnancy.  Your cervix is not ready. What are some risks associated with labor induction? Some risks associated with   labor induction include:  Changes in fetal heart rate, such as being too high, too low, or irregular (erratic).  Failed induction.  Infection in the mother or the baby.  Increased risk of having a cesarean delivery.  Fetal death.  Breaking off (abruption) of the placenta from the uterus (rare).  Rupture of the uterus (very rare). When induction is needed for medical reasons, the benefits of induction generally outweigh the risks. What are some reasons for not inducing labor? Labor induction should not be done if:  Your baby does not tolerate contractions.  You have had previous surgeries on your uterus, such as a myomectomy, removal of fibroids, or a vertical scar from a previous cesarean delivery.  Your placenta lies very low in your uterus and blocks the opening of the cervix (placenta previa).  Your baby is not in a head-down position.  The umbilical cord drops down into the birth canal in front of the baby.  There are unusual circumstances, such as the baby being very early (premature).  You have had more than 2 previous cesarean deliveries. Summary  Labor induction is when steps are taken to cause a pregnant woman to begin the labor process.  Labor induction causes a pregnant woman's uterus to contract. It also causes the cervix to ripen, dilate, and efface.  Labor is not induced before 39 weeks of pregnancy unless there is a medical reason to do so.  When induction is needed for medical reasons, the benefits of induction generally outweigh the risks. This  information is not intended to replace advice given to you by your health care provider. Make sure you discuss any questions you have with your health care provider. Document Revised: 07/13/2017 Document Reviewed: 08/23/2016 Elsevier Patient Education  2020 [employer] Inc[employer]   

## 2019-10-31 ENCOUNTER — Observation Stay
Admission: EM | Admit: 2019-10-31 | Discharge: 2019-10-31 | Disposition: A | Discharge status: Home / Self Care | Payer: Medicaid Other | Source: Home / Self Care | Admitting: Obstetrics & Gynecology

## 2019-10-31 DIAGNOSIS — Z3A36 36 weeks gestation of pregnancy: Secondary | ICD-10-CM | POA: Insufficient documentation

## 2019-10-31 DIAGNOSIS — O99213 Obesity complicating pregnancy, third trimester: Secondary | ICD-10-CM | POA: Diagnosis not present

## 2019-10-31 DIAGNOSIS — O99333 Smoking (tobacco) complicating pregnancy, third trimester: Secondary | ICD-10-CM | POA: Diagnosis not present

## 2019-10-31 DIAGNOSIS — O365930 Maternal care for other known or suspected poor fetal growth, third trimester, not applicable or unspecified: Secondary | ICD-10-CM | POA: Diagnosis not present

## 2019-10-31 DIAGNOSIS — O365990 Maternal care for other known or suspected poor fetal growth, unspecified trimester, not applicable or unspecified: Secondary | ICD-10-CM | POA: Diagnosis present

## 2019-10-31 DIAGNOSIS — O0933 Supervision of pregnancy with insufficient antenatal care, third trimester: Secondary | ICD-10-CM | POA: Diagnosis not present

## 2019-10-31 LAB — CULTURE, BETA STREP (GROUP B ONLY): Strep Gp B Culture: NEGATIVE

## 2019-10-31 MED ORDER — BETAMETHASONE SOD PHOS & ACET 6 (3-3) MG/ML IJ SUSP
12.0000 mg | Freq: Once | INTRAMUSCULAR | Status: AC
  Administered 2019-10-31: 12 mg via INTRAMUSCULAR

## 2019-10-31 NOTE — Progress Notes (Signed)
Pt presents to L&D for 2nd dose of Betamethasone and a NST. Pt denies leaking of fluid, vaginal bleeding, or contractions. VS WNL. C.Gutierrez, CNM aware and orders placed.   Pt received 2nd dose of Betamethasone @1457  and NST. C.Gutierrez, CNM aware and orders placed.

## 2019-10-31 NOTE — Progress Notes (Signed)
Pt discharged home per C.Gutierrez, CNM order. NST reactive and appropriate for gestational age. Pt will return to L&D tomorrow 11/01/2019 @0800  for scheduled IOL. Pt received labor and bleeding precautions and received induction education. All questions answered by RN. Pt in stable condition and discharged home.

## 2019-10-31 NOTE — Final Progress Note (Signed)
Physician Final Progress Note  Patient ID: Cindy Rodriguez MRN: [id number] DOB/AGE: May 18, 1996 24 y.o.  Admit date: 10/31/2019 Admitting provider: Nadara Mustard, MD/ Gasper Lloyd. Sharen Hones, CNM Discharge date: 10/31/2019   Admission Diagnoses: IUP at 36wk6d with fetal growth restirction  Discharge Diagnoses:  Active Problems:   Pregnancy affected by fetal growth restriction IUP at 36wk6d Reactive NST  Consults: None  Significant Findings/ Diagnostic Studies: Cindy Rodriguez is a 24 year old G2, P0010 at 36 weeks and 6 days by a 13wk6d ultrasound. Pregnancy complicated by severe IUGR less than the 3rd percentile. She presents to L&D for her second betamethasone injection and for a non stress test with anticipation of induction of labor at 37 weeks tomorrow.   Patient has been scheduled for an 8 AM induction.  Covid test and GBS PCR were done yesterday and are both negative. Non stress test today is reactive with baseline of 120 and 15x15 accelerations to 140s and moderate variability.  She received her second dose of steroids today.  She will return tomorrow to labor and delivery for her induction.   Procedures: Non stress test  Discharge Condition: stable  Disposition: Discharge disposition: 01-Home or Self Care       Diet: Regular diet  Discharge Activity: Activity as tolerated   Allergies as of 10/31/2019      Reactions   Mango Butter Itching   Mangos      Medication List    TAKE these medications   aspirin EC 81 MG tablet Take 1 tablet (81 mg total) by mouth daily. Take after 12 weeks for prevention of preeclampsia later in pregnancy   Prenatal Gummies/DHA & FA 0.4-32.5 MG Chew Chew 1 tablet by mouth daily.        Total time spent taking care of this patient: 10 minutes in review of chart and interpretation of nonstress test.   Signed: Farrel Conners 10/31/2019, 3:32 PM

## 2019-11-01 ENCOUNTER — Inpatient Hospital Stay: Payer: Medicaid Other | Admitting: Anesthesiology

## 2019-11-01 ENCOUNTER — Encounter
Admission: EM | Disposition: A | Discharge status: Home / Self Care | Payer: Self-pay | Source: Home / Self Care | Attending: Obstetrics and Gynecology

## 2019-11-01 ENCOUNTER — Inpatient Hospital Stay
Admission: EM | Admit: 2019-11-01 | Discharge: 2019-11-04 | DRG: 787 | Disposition: A | Discharge status: Home / Self Care | Payer: Medicaid Other | Attending: Obstetrics and Gynecology | Admitting: Obstetrics and Gynecology

## 2019-11-01 ENCOUNTER — Encounter: Payer: Self-pay | Admitting: Obstetrics and Gynecology

## 2019-11-01 ENCOUNTER — Other Ambulatory Visit: Payer: Self-pay

## 2019-11-01 DIAGNOSIS — O09899 Supervision of other high risk pregnancies, unspecified trimester: Secondary | ICD-10-CM | POA: Diagnosis not present

## 2019-11-01 DIAGNOSIS — O99214 Obesity complicating childbirth: Secondary | ICD-10-CM | POA: Diagnosis present

## 2019-11-01 DIAGNOSIS — Z20822 Contact with and (suspected) exposure to covid-19: Secondary | ICD-10-CM | POA: Diagnosis present

## 2019-11-01 DIAGNOSIS — O09892 Supervision of other high risk pregnancies, second trimester: Secondary | ICD-10-CM

## 2019-11-01 DIAGNOSIS — Z880 Allergy status to penicillin: Secondary | ICD-10-CM | POA: Diagnosis not present

## 2019-11-01 DIAGNOSIS — Z87891 Personal history of nicotine dependence: Secondary | ICD-10-CM | POA: Diagnosis not present

## 2019-11-01 DIAGNOSIS — Z3A37 37 weeks gestation of pregnancy: Secondary | ICD-10-CM | POA: Diagnosis not present

## 2019-11-01 DIAGNOSIS — O365930 Maternal care for other known or suspected poor fetal growth, third trimester, not applicable or unspecified: Principal | ICD-10-CM | POA: Diagnosis present

## 2019-11-01 DIAGNOSIS — A5901 Trichomonal vulvovaginitis: Secondary | ICD-10-CM

## 2019-11-01 DIAGNOSIS — Z91199 Patient's noncompliance with other medical treatment and regimen due to unspecified reason: Secondary | ICD-10-CM

## 2019-11-01 DIAGNOSIS — O99210 Obesity complicating pregnancy, unspecified trimester: Secondary | ICD-10-CM

## 2019-11-01 DIAGNOSIS — O9081 Anemia of the puerperium: Secondary | ICD-10-CM | POA: Diagnosis not present

## 2019-11-01 DIAGNOSIS — O99334 Smoking (tobacco) complicating childbirth: Secondary | ICD-10-CM | POA: Diagnosis not present

## 2019-11-01 DIAGNOSIS — D62 Acute posthemorrhagic anemia: Secondary | ICD-10-CM | POA: Diagnosis not present

## 2019-11-01 DIAGNOSIS — O99330 Smoking (tobacco) complicating pregnancy, unspecified trimester: Secondary | ICD-10-CM

## 2019-11-01 DIAGNOSIS — Z8616 Personal history of COVID-19: Secondary | ICD-10-CM

## 2019-11-01 LAB — TYPE AND SCREEN
ABO/RH(D): O POS
Antibody Screen: NEGATIVE

## 2019-11-01 LAB — CBC
HCT: 31.5 % — ABNORMAL LOW (ref 36.0–46.0)
Hemoglobin: 11 g/dL — ABNORMAL LOW (ref 12.0–15.0)
MCH: 29.4 pg (ref 26.0–34.0)
MCHC: 34.9 g/dL (ref 30.0–36.0)
MCV: 84.2 fL (ref 80.0–100.0)
Platelets: 219 10*3/uL (ref 150–400)
RBC: 3.74 MIL/uL — ABNORMAL LOW (ref 3.87–5.11)
RDW: 13.8 % (ref 11.5–15.5)
WBC: 18.6 10*3/uL — ABNORMAL HIGH (ref 4.0–10.5)
nRBC: 0 % (ref 0.0–0.2)

## 2019-11-01 LAB — ABO/RH: ABO/RH(D): O POS

## 2019-11-01 LAB — URINE DRUG SCREEN, QUALITATIVE (ARMC ONLY)
Amphetamines, Ur Screen: NOT DETECTED
Barbiturates, Ur Screen: NOT DETECTED
Benzodiazepine, Ur Scrn: NOT DETECTED
Cannabinoid 50 Ng, Ur ~~LOC~~: POSITIVE — AB
Cocaine Metabolite,Ur ~~LOC~~: NOT DETECTED
MDMA (Ecstasy)Ur Screen: NOT DETECTED
Methadone Scn, Ur: NOT DETECTED
Opiate, Ur Screen: NOT DETECTED
Phencyclidine (PCP) Ur S: NOT DETECTED
Tricyclic, Ur Screen: NOT DETECTED

## 2019-11-01 LAB — RAPID HIV SCREEN (HIV 1/2 AB+AG)
HIV 1/2 Antibodies: NONREACTIVE
HIV-1 P24 Antigen - HIV24: NONREACTIVE

## 2019-11-01 SURGERY — Surgical Case
Anesthesia: Spinal

## 2019-11-01 MED ORDER — BUPIVACAINE HCL (PF) 0.5 % IJ SOLN
INTRAMUSCULAR | Status: DC | PRN
  Administered 2019-11-01: 10 mL

## 2019-11-01 MED ORDER — TERBUTALINE SULFATE 1 MG/ML IJ SOLN
INTRAMUSCULAR | Status: AC
  Administered 2019-11-01: 0.25 mg via SUBCUTANEOUS
  Filled 2019-11-01: qty 1

## 2019-11-01 MED ORDER — DEXAMETHASONE SODIUM PHOSPHATE 10 MG/ML IJ SOLN
INTRAMUSCULAR | Status: DC | PRN
  Administered 2019-11-01: 5 mg via INTRAVENOUS

## 2019-11-01 MED ORDER — ACETAMINOPHEN 325 MG PO TABS: 650.0000 mg | ORAL_TABLET | ORAL | Status: DC | PRN

## 2019-11-01 MED ORDER — MEPERIDINE HCL 25 MG/ML IJ SOLN: 6.2500 mg | INTRAMUSCULAR | Status: DC | PRN

## 2019-11-01 MED ORDER — BUTORPHANOL TARTRATE 1 MG/ML IJ SOLN: 2.0000 mg | INTRAMUSCULAR | Status: DC | PRN

## 2019-11-01 MED ORDER — ONDANSETRON HCL 4 MG/2ML IJ SOLN
INTRAMUSCULAR | Status: DC | PRN
  Administered 2019-11-01: 4 mg via INTRAVENOUS

## 2019-11-01 MED ORDER — KETOROLAC TROMETHAMINE 30 MG/ML IJ SOLN
INTRAMUSCULAR | Status: AC
  Administered 2019-11-01: 30 mg via INTRAVENOUS
  Filled 2019-11-01: qty 1

## 2019-11-01 MED ORDER — LACTATED RINGERS IV SOLN: INTRAVENOUS | Status: DC

## 2019-11-01 MED ORDER — OXYTOCIN 40 UNITS IN NORMAL SALINE INFUSION - SIMPLE MED
1.0000 m[IU]/min | INTRAVENOUS | Status: DC
  Administered 2019-11-01: 2 m[IU]/min via INTRAVENOUS

## 2019-11-01 MED ORDER — LACTATED RINGERS IV SOLN
500.0000 mL | INTRAVENOUS | Status: DC | PRN
  Administered 2019-11-01 (×2): 500 mL via INTRAVENOUS

## 2019-11-01 MED ORDER — MORPHINE SULFATE (PF) 0.5 MG/ML IJ SOLN
INTRAMUSCULAR | Status: AC
  Filled 2019-11-01: qty 10

## 2019-11-01 MED ORDER — BUPIVACAINE 0.25 % ON-Q PUMP DUAL CATH 400 ML
400.0000 mL | INJECTION | Status: DC
  Filled 2019-11-01: qty 400

## 2019-11-01 MED ORDER — CEFAZOLIN SODIUM-DEXTROSE 2-4 GM/100ML-% IV SOLN
2.0000 g | INTRAVENOUS | Status: AC
  Administered 2019-11-01: 2 g via INTRAVENOUS
  Filled 2019-11-01: qty 100

## 2019-11-01 MED ORDER — TERBUTALINE SULFATE 1 MG/ML IJ SOLN: 0.2500 mg | Freq: Once | INTRAMUSCULAR | Status: AC | PRN

## 2019-11-01 MED ORDER — DEXAMETHASONE SODIUM PHOSPHATE 10 MG/ML IJ SOLN
INTRAMUSCULAR | Status: AC
  Filled 2019-11-01: qty 1

## 2019-11-01 MED ORDER — MENTHOL 3 MG MT LOZG
1.0000 | LOZENGE | OROMUCOSAL | Status: DC | PRN
  Filled 2019-11-01: qty 9

## 2019-11-01 MED ORDER — DIBUCAINE (PERIANAL) 1 % EX OINT: 1.0000 "application " | TOPICAL_OINTMENT | CUTANEOUS | Status: DC | PRN

## 2019-11-01 MED ORDER — MORPHINE SULFATE (PF) 0.5 MG/ML IJ SOLN
INTRAMUSCULAR | Status: DC | PRN
  Administered 2019-11-01: .125 mg via INTRATHECAL

## 2019-11-01 MED ORDER — BUPIVACAINE HCL (PF) 0.5 % IJ SOLN
INTRAMUSCULAR | Status: AC
  Filled 2019-11-01: qty 30

## 2019-11-01 MED ORDER — COCONUT OIL OIL: 1.0000 "application " | TOPICAL_OIL | Status: DC | PRN

## 2019-11-01 MED ORDER — LACTATED RINGERS IV SOLN: INTRAVENOUS | Status: DC | PRN

## 2019-11-01 MED ORDER — NALOXONE HCL 0.4 MG/ML IJ SOLN: 0.4000 mg | INTRAMUSCULAR | Status: DC | PRN

## 2019-11-01 MED ORDER — BISACODYL 10 MG RE SUPP: 10.0000 mg | Freq: Every day | RECTAL | Status: DC | PRN

## 2019-11-01 MED ORDER — MISOPROSTOL 25 MCG QUARTER TABLET
25.0000 ug | ORAL_TABLET | ORAL | Status: DC | PRN
  Administered 2019-11-01: 25 ug via VAGINAL
  Filled 2019-11-01 (×2): qty 1

## 2019-11-01 MED ORDER — OXYCODONE HCL 5 MG PO TABS: 5.0000 mg | ORAL_TABLET | ORAL | Status: DC | PRN

## 2019-11-01 MED ORDER — NALOXONE HCL 4 MG/10ML IJ SOLN
1.0000 ug/kg/h | INTRAVENOUS | Status: DC | PRN
  Filled 2019-11-01: qty 5

## 2019-11-01 MED ORDER — SODIUM CHLORIDE 0.9% FLUSH: 3.0000 mL | INTRAVENOUS | Status: DC | PRN

## 2019-11-01 MED ORDER — OXYTOCIN 40 UNITS IN NORMAL SALINE INFUSION - SIMPLE MED
INTRAVENOUS | Status: DC | PRN
  Administered 2019-11-01: 40 [IU] via INTRAVENOUS

## 2019-11-01 MED ORDER — OXYCODONE HCL 5 MG PO TABS: 10.0000 mg | ORAL_TABLET | ORAL | Status: DC | PRN

## 2019-11-01 MED ORDER — IBUPROFEN 800 MG PO TABS: 800.0000 mg | ORAL_TABLET | Freq: Three times a day (TID) | ORAL | Status: DC

## 2019-11-01 MED ORDER — NALBUPHINE HCL 10 MG/ML IJ SOLN: 5.0000 mg | Freq: Once | INTRAMUSCULAR | Status: DC | PRN

## 2019-11-01 MED ORDER — FERROUS SULFATE 325 (65 FE) MG PO TABS: 325.0000 mg | ORAL_TABLET | Freq: Two times a day (BID) | ORAL | Status: DC

## 2019-11-01 MED ORDER — SOD CITRATE-CITRIC ACID 500-334 MG/5ML PO SOLN: 30.0000 mL | ORAL | Status: DC

## 2019-11-01 MED ORDER — SENNOSIDES-DOCUSATE SODIUM 8.6-50 MG PO TABS: 2.0000 | ORAL_TABLET | ORAL | Status: DC

## 2019-11-01 MED ORDER — PRENATAL MULTIVITAMIN CH: 1.0000 | ORAL_TABLET | Freq: Every day | ORAL | Status: DC

## 2019-11-01 MED ORDER — ACETAMINOPHEN 500 MG PO TABS
1000.0000 mg | ORAL_TABLET | Freq: Four times a day (QID) | ORAL | Status: DC
  Filled 2019-11-01: qty 2

## 2019-11-01 MED ORDER — ONDANSETRON HCL 4 MG/2ML IJ SOLN
INTRAMUSCULAR | Status: AC
  Filled 2019-11-01: qty 2

## 2019-11-01 MED ORDER — SODIUM CHLORIDE 0.9 % IV SOLN
INTRAVENOUS | Status: DC | PRN
  Administered 2019-11-01: 35 ug/min via INTRAVENOUS

## 2019-11-01 MED ORDER — NALBUPHINE HCL 10 MG/ML IJ SOLN: 5.0000 mg | INTRAMUSCULAR | Status: DC | PRN

## 2019-11-01 MED ORDER — BUPIVACAINE ON-Q PAIN PUMP (FOR ORDER SET NO CHG)
INJECTION | Status: DC
  Filled 2019-11-01: qty 1

## 2019-11-01 MED ORDER — ONDANSETRON HCL 4 MG/2ML IJ SOLN: 4.0000 mg | Freq: Four times a day (QID) | INTRAMUSCULAR | Status: DC | PRN

## 2019-11-01 MED ORDER — OXYTOCIN 40 UNITS IN NORMAL SALINE INFUSION - SIMPLE MED
2.5000 [IU]/h | INTRAVENOUS | Status: AC
  Administered 2019-11-01: 2.5 [IU]/h via INTRAVENOUS
  Filled 2019-11-01: qty 1000

## 2019-11-01 MED ORDER — SCOPOLAMINE 1 MG/3DAYS TD PT72: 1.0000 | MEDICATED_PATCH | Freq: Once | TRANSDERMAL | Status: DC

## 2019-11-01 MED ORDER — FENTANYL CITRATE (PF) 100 MCG/2ML IJ SOLN
INTRAMUSCULAR | Status: DC | PRN
  Administered 2019-11-01: 10 ug via INTRATHECAL

## 2019-11-01 MED ORDER — HYDROMORPHONE HCL 1 MG/ML IJ SOLN: 0.2000 mg | INTRAMUSCULAR | Status: DC | PRN

## 2019-11-01 MED ORDER — SIMETHICONE 80 MG PO CHEW
80.0000 mg | CHEWABLE_TABLET | ORAL | Status: DC | PRN
  Administered 2019-11-03: 80 mg via ORAL

## 2019-11-01 MED ORDER — SOD CITRATE-CITRIC ACID 500-334 MG/5ML PO SOLN
30.0000 mL | ORAL | Status: DC | PRN
  Filled 2019-11-01: qty 30

## 2019-11-01 MED ORDER — BUPIVACAINE IN DEXTROSE 0.75-8.25 % IT SOLN
INTRATHECAL | Status: DC | PRN
  Administered 2019-11-01: 1.5 mL via INTRATHECAL

## 2019-11-01 MED ORDER — WITCH HAZEL-GLYCERIN EX PADS: 1.0000 "application " | MEDICATED_PAD | CUTANEOUS | Status: DC | PRN

## 2019-11-01 MED ORDER — OXYTOCIN BOLUS FROM INFUSION: 500.0000 mL | Freq: Once | INTRAVENOUS | Status: DC

## 2019-11-01 MED ORDER — ONDANSETRON HCL 4 MG/2ML IJ SOLN: 4.0000 mg | Freq: Three times a day (TID) | INTRAMUSCULAR | Status: DC | PRN

## 2019-11-01 MED ORDER — KETOROLAC TROMETHAMINE 30 MG/ML IJ SOLN
30.0000 mg | Freq: Four times a day (QID) | INTRAMUSCULAR | Status: AC
  Administered 2019-11-02 (×3): 30 mg via INTRAVENOUS
  Filled 2019-11-01 (×3): qty 1

## 2019-11-01 MED ORDER — ACETAMINOPHEN 500 MG PO TABS: 1000.0000 mg | ORAL_TABLET | Freq: Four times a day (QID) | ORAL | Status: DC

## 2019-11-01 MED ORDER — KETOROLAC TROMETHAMINE 30 MG/ML IJ SOLN: 30.0000 mg | Freq: Four times a day (QID) | INTRAMUSCULAR | Status: AC

## 2019-11-01 MED ORDER — LIDOCAINE HCL (PF) 1 % IJ SOLN: 30.0000 mL | INTRAMUSCULAR | Status: DC | PRN

## 2019-11-01 MED ORDER — FLEET ENEMA 7-19 GM/118ML RE ENEM: 1.0000 | ENEMA | Freq: Every day | RECTAL | Status: DC | PRN

## 2019-11-01 MED ORDER — OXYTOCIN 40 UNITS IN NORMAL SALINE INFUSION - SIMPLE MED
2.5000 [IU]/h | INTRAVENOUS | Status: DC
  Filled 2019-11-01 (×2): qty 1000

## 2019-11-01 MED ORDER — DIPHENHYDRAMINE HCL 25 MG PO CAPS: 25.0000 mg | ORAL_CAPSULE | Freq: Four times a day (QID) | ORAL | Status: DC | PRN

## 2019-11-01 MED ORDER — FENTANYL CITRATE (PF) 100 MCG/2ML IJ SOLN
INTRAMUSCULAR | Status: AC
  Filled 2019-11-01: qty 2

## 2019-11-01 MED ORDER — PHENYLEPHRINE HCL (PRESSORS) 10 MG/ML IV SOLN
INTRAVENOUS | Status: AC
  Filled 2019-11-01: qty 1

## 2019-11-01 MED ORDER — TERBUTALINE SULFATE 1 MG/ML IJ SOLN: 0.2500 mg | Freq: Once | INTRAMUSCULAR | Status: AC

## 2019-11-01 MED ORDER — SIMETHICONE 80 MG PO CHEW
80.0000 mg | CHEWABLE_TABLET | Freq: Three times a day (TID) | ORAL | Status: DC
  Administered 2019-11-02 – 2019-11-04 (×4): 80 mg via ORAL
  Filled 2019-11-01 (×5): qty 1

## 2019-11-01 MED ORDER — SIMETHICONE 80 MG PO CHEW
80.0000 mg | CHEWABLE_TABLET | ORAL | Status: DC
  Filled 2019-11-01: qty 1

## 2019-11-01 MED ORDER — ZOLPIDEM TARTRATE 5 MG PO TABS: 5.0000 mg | ORAL_TABLET | Freq: Every evening | ORAL | Status: DC | PRN

## 2019-11-01 SURGICAL SUPPLY — 27 items
CANISTER SUCT 3000ML PPV (MISCELLANEOUS) ×2 IMPLANT
CHLORAPREP W/TINT 26 (MISCELLANEOUS) ×4 IMPLANT
DERMABOND ADVANCED (GAUZE/BANDAGES/DRESSINGS) ×1 IMPLANT
DERMABOND ADVANCED .7 DNX12 (GAUZE/BANDAGES/DRESSINGS) ×1 IMPLANT
DRSG OPSITE POSTOP 4X10 (GAUZE/BANDAGES/DRESSINGS) ×2 IMPLANT
DRSG OPSITE POSTOP 4X14 (GAUZE/BANDAGES/DRESSINGS) ×2 IMPLANT
ELECT CAUTERY BLADE 6.4 (BLADE) ×2 IMPLANT
ELECT REM PT RETURN 9FT ADLT (ELECTROSURGICAL) ×2 IMPLANT
ELECTRODE REM PT RTRN 9FT ADLT (ELECTROSURGICAL) ×1 IMPLANT
GLOVE BIOGEL PI IND STRL 6.5 (GLOVE) ×2 IMPLANT
GLOVE BIOGEL PI INDICATOR 6.5 (GLOVE) ×2 IMPLANT
GOWN STRL REUS W/ TWL LRG LVL3 (GOWN DISPOSABLE) ×1 IMPLANT
GOWN STRL REUS W/ TWL XL LVL3 (GOWN DISPOSABLE) ×2 IMPLANT
GOWN STRL REUS W/TWL LRG LVL3 (GOWN DISPOSABLE) ×2 IMPLANT
GOWN STRL REUS W/TWL XL LVL3 (GOWN DISPOSABLE) ×4 IMPLANT
NS IRRIG 1000ML POUR BTL (IV SOLUTION) ×2 IMPLANT
PACK C SECTION AR (MISCELLANEOUS) ×2 IMPLANT
PAD OB MATERNITY 4.3X12.25 (PERSONAL CARE ITEMS) ×2 IMPLANT
PAD PREP 24X41 OB/GYN DISP (PERSONAL CARE ITEMS) ×2 IMPLANT
PENCIL SMOKE ULTRAEVAC 22 CON (MISCELLANEOUS) ×2 IMPLANT
RETRACTOR TRAXI PANNICULUS (MISCELLANEOUS) IMPLANT
SUT MNCRL AB 4-0 PS2 18 (SUTURE) ×2 IMPLANT
SUT PLAIN 3-0 (SUTURE) ×2 IMPLANT
SUT VIC AB 0 CT1 36 (SUTURE) ×8 IMPLANT
SUT VIC AB 2-0 CT1 36 (SUTURE) ×2 IMPLANT
SYR 30ML LL (SYRINGE) ×4 IMPLANT
TRAXI PANNICULUS RETRACTOR (MISCELLANEOUS) IMPLANT

## 2019-11-01 NOTE — H&P (Signed)
Cindy Rodriguez is an 24 y.o. female.   Chief Complaint: Scheduled Induction of Labor   HPI: She presents to L&D today for scheduled IOL because of severe IUGR less than the 3rd percentile. UA dopplers normal. She is dated by a 13 week [place]. She received betamethasone on 10/30/2019 and 10/31/2019. She has no complaints. Denies vaginal bleeding. Reports occasional mild contractions. Denies leakage of fluid. Is having normal fetal movement.   Pregnancy has been complicated by tobacco use, patient has been able to quit this pregnancy. Trichomonas infection, treated and test of cure negative. Limited prenatal care, and IUGR less than the 3rd percentile.      Pregnancy 2020 Problems (from 05/05/19 to present)    Problem Noted Resolved   Noncompliant pregnant patient no prenatal care x 16 wks 08/27/2019 by Debera Lat, RN No   Overview Addendum 09/03/2019  4:00 PM by Debera Lat, RN    No response to calls; missed appointment letter mailed regular & certified mail 0/9/32=> Card returned signed by "ALP"         Trichomonal vaginitis in pregnancy in second trimester 05/08/2019 by Debera Lat, RN No   Overview Addendum 10/20/2019 10:29 AM by Charlotte Sanes L, PA-C    Treated 05/09/19 with Metronidazole 2gm po; partner referral [x]  Retest 09/24/19: negative      Supervision of other high risk pregnancy, antepartum 05/05/2019 by Caren Macadam, MD No   Overview Addendum 10/31/2019 11:45 AM by Kandee Keen, PA-C     Nursing Staff Provider  Office Location  [facility] Dating   [redacted]w[redacted]d [place]  Language  English Anatomy US   WNL but inadequate heart/face views 06/11/19 @ 16 4/7 wk;  f/u 07/09/19 also wnl but w/some inadequate views. F/u 10/01/19 w/RVOT, 3VC and nose/lip seen/WNL.  Flu Vaccine  Declined 05/05/19 Genetic Screen     Quad: POS (AFP 1:52)   TDaP vaccine   09/24/2019 Hgb A1C or  GTT Early  Third trimester = 92 (WNL)  Rhogam   NA   LAB RESULTS   Feeding Plan  Bottle Blood Type  O/Positive/-- (10/12 1030)   Contraception Condoms Antibody Negative (10/12 1030)  Circumcision  Rubella 1.18 (10/12 1030)/Immune  Pediatrician  [facility] RPR Non Reactive (10/12 1030)   Support Person  HBsAg Negative (10/12 1030)   Prenatal Classes  HIV Non reactive (10/12 0000)    Varicella   BTL Consent  GBS  (For PCN allergy, check sensitivities)        VBAC Consent  Pap      Hgb Electro    BP Cuff ordered  CF   Delivery Group  [facility]   Centering Group            Obesity affecting pregnancy, antepartum BMI=44.2 05/05/2019 by Caren Macadam, MD No   Overview Addendum 10/20/2019 10:30 AM by Kandee Keen, PA-C    Recommendations [x]  Aspirin 81 mg daily after 12 weeks; - pt never started (09/24/19) [x]  Nutrition consult- has WIC [x]  Weight gain 11-20 lbs for singleton and 25-35 lbs for twin pregnancy (IOM guidelines) . Higher class of obesity patients recommended to gain closer to lower limit  . Weight loss is associated with adverse outcomes [ ]  Baseline and surveillance labs (pulled in from [facility], refresh links as needed)  Lab Results  Component Value Date   PLT 217 03/23/2019   CREATININE 0.63 03/23/2019   AST 15 03/23/2019   ALT 14 03/23/2019    Antenatal Testing:  Not indicated.  [ ]  Growth scans every 4-6 weeks as needed (fundal height likely inadequate in morbidly obese patients)  Postpartum Care: [ ]  Consider prophylactic wound vac/PICO for C/S [ ]  Lovenox for DVT/PE prophylaxis (6 hours after vaginal delivery, 12 hours after C/S).    Lovenox 40 mg [place] q24h (BMI 30.0-39.9 kg/m2)   Lovenox 0.5 mg/kg [place] q12h ((BMI ?40 kg/m2 ); Max 150 mg [place] q12h.   Consider prolonged therapy x 6 weeks PP in very concerning patients (I.e morbid obesity with other co-morbidities that increase risk of DVT/PE) [ ]  Counsel about diet, exercise and weight loss. Referrals PRN.  ICD10 Codes: O99.210   Obesity in pregnancy (BMI 30.0-39.9 kg/m2)  O99.210, E66.01 Maternal Morbid  Obesity (BMI ?40 kg/m2 ).**Have to use both codes, this is a HCC code and risk adjusts/more reimbursement**  Obesity is defined as body mass index (BMI) ?30 kg/m2 .  Class I (BMI 30.0 to 34.9 kg/m2) . Class II (BMI 35.0 to 39.9 kg/m2) . Class III/Morbid obesity (BMI ?40 kg/       Tobacco use in pregnancy, antepartum 05/05/2019 by , MD No   Overview Signed 10/20/2019 10:29 AM by Marland Kitchen, PA-C    States last cigarette 05/2019          Past Medical History:  Diagnosis Date  . Dyspnea 03/27/2019   + Covid-19 (03/23/2019)    No past surgical history on file.  Family History  Problem Relation Age of Onset  . Hypertension Mother   . Diabetes Mother   . Asthma Mother   . Asthma Sister   . Thyroid disease Maternal Grandmother   . Seizures Maternal Grandmother   . Cancer Maternal Grandmother   . Asthma Maternal Grandmother   . Drug abuse Maternal Grandfather   . Cancer Maternal Grandfather   . Sickle cell trait Maternal Grandfather   . Hypertension Paternal Grandmother   . Cirrhosis Paternal Grandmother   . Hypertension Paternal Grandfather   . Drug abuse Paternal Grandfather   . Heart disease Paternal Grandfather   . Cirrhosis Paternal Grandfather    Social History:  reports that she has quit smoking. Her smoking use included cigarettes. She smoked 0.50 packs per day. She has never used smokeless tobacco. She reports previous alcohol use. She reports previous drug use.  Allergies:  Allergies  Allergen Reactions  . Mango Butter Itching    Mangos    Medications Prior to Admission  Medication Sig Dispense Refill  . aspirin EC 81 MG tablet Take 1 tablet (81 mg total) by mouth daily. Take after 12 weeks for prevention of preeclampsia later in pregnancy (Patient not taking: Reported on 05/12/2019) 300 tablet 2  . Prenatal MV-Min-FA-Omega-3 (PRENATAL GUMMIES/DHA & FA) 0.4-32.5 MG CHEW Chew 1 tablet by mouth daily. 30 tablet 11    Results for  orders placed or performed during the hospital encounter of 10/30/19 (from the past 48 hour(s))  SARS CORONAVIRUS 2 (TAT 6-24 HRS) Nasopharyngeal Nasopharyngeal Swab     Status: None   Collection Time: 10/30/19  3:11 PM   Specimen: Nasopharyngeal Swab  Result Value Ref Range   SARS Coronavirus 2 NEGATIVE NEGATIVE    Comment: (NOTE) SARS-CoV-2 target nucleic acids are NOT DETECTED. The SARS-CoV-2 RNA is generally detectable in upper and lower respiratory specimens during the acute phase of infection. Negative results do not preclude SARS-CoV-2 infection, do not rule out co-infections with other pathogens, and should not be used as the sole  basis for treatment or other patient management decisions. Negative results must be combined with clinical observations, patient history, and epidemiological information. The expected result is Negative. Fact Sheet for Patients: HairSlick.no Fact Sheet for Healthcare Providers: quierodirigir.com This test is not yet approved or cleared by the [place] FDA and  has been authorized for detection and/or diagnosis of SARS-CoV-2 by FDA under an Emergency Use Authorization (EUA). This EUA will remain  in effect (meaning this test can be used) for the duration of the COVID-19 declaration under Section 56 4(b)(1) of the Act, 21 U.S.C. section 360bbb-3(b)(1), unless the authorization is terminated or revoked sooner. Performed at [facility], [address], [place], [place] [address]    No results found.  Review of Systems  Constitutional: Negative for chills and fever.  HENT: Negative for congestion, hearing loss and sinus pain.   Respiratory: Negative for cough, shortness of breath and wheezing.   Cardiovascular: Negative for chest pain, palpitations and leg swelling.  Gastrointestinal: Negative for abdominal pain, constipation, diarrhea, nausea and vomiting.  Genitourinary: Negative  for dysuria, flank pain, frequency, hematuria and urgency.  Musculoskeletal: Negative for back pain.  Skin: Negative for rash.  Neurological: Negative for dizziness and headaches.  Psychiatric/Behavioral: Negative for suicidal ideas. The patient is not nervous/anxious.     Last menstrual period 01/25/2019. Physical Exam  Nursing note and vitals reviewed. Constitutional: She is oriented to person, place, and time. She appears well-developed and well-nourished.  HENT:  Head: Normocephalic and atraumatic.  Cardiovascular: Normal rate and regular rhythm.  Respiratory: Effort normal and breath sounds normal.  GI: Soft. Bowel sounds are normal.  Musculoskeletal:        General: Normal range of motion.  Neurological: She is alert and oriented to person, place, and time.  Skin: Skin is warm and dry.  Psychiatric: She has a normal mood and affect. Her behavior is normal. Judgment and thought content normal.   NST: 120 bpm baseline, moderate variability, 15x15 accelerations, no decelerations. Tocometer : irregular  SVE: 1/20/-3   Assessment/Plan 24 yo G2P0010 [redacted]w[redacted]d with severe IUGR less than 3rd percentile.  Will proceed with Induction of labor.  Have discussed expectations for induction process. Will begin Induction with cytotec.  GBS negative.  Reviewed patient's options for pain management during labor.   Natale Milch, MD 11/01/2019, 8:27 AM

## 2019-11-01 NOTE — Discharge Summary (Signed)
OB Discharge Summary     Patient Name: Cindy Rodriguez DOB: [birth year] MRN: [id number]  Date of admission: 11/01/2019 Delivering MD: Homero Fellers, MD  Date of Delivery: 11/01/2019  Date of discharge: 11/04/2019  Admitting diagnosis: IUGR (intrauterine growth restriction) affecting care of mother, third trimester, not applicable or unspecified fetus [O36.5930] Intrauterine pregnancy: [redacted]w[redacted]d     Secondary diagnosis: None     Discharge diagnosis: Term Pregnancy Delivered,                          Hospital course:  Induction of Labor With Cesarean Section  24 y.o. yo G2P1011 at [redacted]w[redacted]d was admitted to the hospital 11/01/2019 for induction of labor. Patient had a labor course significant for fetal intolerance of labor. The patient went for cesarean section due to Non-Reassuring FHR, and delivered a Viable infant,11/01/2019  Membrane Rupture Time/Date:  ,   Details of operation can be found in separate operative Note.  Patient had an uncomplicated postpartum course. She is ambulating, tolerating a regular diet, passing flatus, and urinating well.  Patient is discharged home in stable condition on 11/04/19.                                                                                                   Post partum procedures:none  Complications: None  Physical exam on 11/04/2019: Vitals:   11/03/19 1201 11/03/19 1558 11/03/19 2322 11/04/19 0732  BP: (!) 113/59 110/67 117/62 113/69  Pulse: 65 71 68 64  Resp: 16 16 20 20   Temp: 98.5 F (36.9 C) 98.7 F (37.1 C) 98 F (36.7 C) 98.8 F (37.1 C)  TempSrc: Oral Oral Oral Oral  SpO2: 100% 100% 100% 100%  Weight:      Height:       General: alert, cooperative and no distress Lochia: appropriate Uterine Fundus: firm Incision: Healing well with no significant drainage, Dressing is clean, dry, and intact DVT Evaluation: No evidence of DVT seen on physical exam. No cords or calf tenderness. No significant calf/ankle edema.  Labs: Lab  Results  Component Value Date   WBC 20.4 (H) 11/02/2019   HGB 10.4 (L) 11/02/2019   HCT 30.2 (L) 11/02/2019   MCV 84.1 11/02/2019   PLT 189 11/02/2019   CMP Latest Ref Rng & Units 05/05/2019  Glucose 65 - 99 mg/dL 77  BUN 6 - 20 mg/dL 8  Creatinine 0.57 - 1.00 mg/dL 0.59  Sodium 134 - 144 mmol/L 136  Potassium 3.5 - 5.2 mmol/L 4.1  Chloride 96 - 106 mmol/L 102  CO2 20 - 29 mmol/L 20  Calcium 8.7 - 10.2 mg/dL 9.5  Total Protein 6.0 - 8.5 g/dL 7.2  Total Bilirubin 0.0 - 1.2 mg/dL 0.3  Alkaline Phos 39 - 117 IU/L 40  AST 0 - 40 IU/L 11  ALT 0 - 32 IU/L 12    Discharge instruction: per After Visit Summary.  Medications:  Allergies as of 11/04/2019      Reactions   Mango Butter Itching   Mangos  Medication List    STOP taking these medications   aspirin EC 81 MG tablet     TAKE these medications   ferrous sulfate 220 (44 Fe) MG/5ML solution Take 7.5 mLs (330 mg total) by mouth daily.   ibuprofen 100 MG/5ML suspension Commonly known as: ADVIL Take 40 mLs (800 mg total) by mouth every 8 (eight) hours.   oxyCODONE 5 MG immediate release tablet Commonly known as: Oxy IR/ROXICODONE Take 1-2 tablets (5-10 mg total) by mouth every 6 (six) hours as needed for breakthrough pain.   Prenatal Gummies/DHA & FA 0.4-32.5 MG Chew Chew 1 tablet by mouth daily.            Discharge Care Instructions  (From admission, onward)         Start     Ordered   11/04/19 0000  Discharge wound care:    Comments: Perform wound care instructions   11/04/19 1000          Diet: routine diet  Activity: Advance as tolerated. Pelvic rest for 6 weeks.   Outpatient follow up: Follow-up Information    Natale Milch, MD. Schedule an appointment as soon as possible for a visit on 11/14/2019.   Specialty: Obstetrics and Gynecology Why: For incision check on 4/23 @ 2:50 pm with Dr. Jerene Pitch. Contact information: [address] [place] [address] [phone]              Postpartum contraception: undecided Rhogam Given postpartum: no Rubella vaccine given postpartum: no Varicella vaccine given postpartum: no TDaP given antepartum or postpartum: given 09/24/2019  Newborn Data: Live born female  Birth Weight: 4 lb 12.5 oz (2170 g) APGAR: 8, 9  Newborn Delivery   Birth date/time: 11/01/2019 15:26:00 Delivery type: C-Section, Low Transverse Trial of labor: Yes C-section categorization: Primary       Baby Feeding: formula  Disposition:home with mother  SIGNED: Thomasene Mohair, MD 11/04/2019 10:02 AM

## 2019-11-01 NOTE — Op Note (Signed)
Cesarean Section Procedure Note 11/01/19  Pre-operative Diagnosis:  1. Fetal intolerance to labor, remote from delivery  2. IUGR, less than 3rd percentile Post-operative Diagnosis: same, delivered. Procedure: Primary Low Transverse Cesarean Section  Surgeon: Adelene Idler MD   Assistant(s): Kandice Hams ST- No other skilled surgical assistant available. Anesthesia: spinal Estimated Blood Loss: 300 cc Complications: None; patient tolerated the procedure well.  Disposition: PACU - hemodynamically stable. Condition: stable   Findings: A female infant in the cephalic presentation. Amniotic fluid - clear Birth weight:2179 grams Apgars of 8 and 9.  Intact placenta with a three-vessel cord. Grossly normal uterus, tubes and ovaries bilaterally. No intraabdominal adhesions were noted.   Procedure Details    The patient was taken to operating room, identified as the correct patient and the procedure verified as C-Section Delivery. A time out was held and the above information confirmed. After induction of anesthesia, the patient was draped and prepped in the usual sterile manner. A Pfannenstiel incision was made and carried down through the subcutaneous tissue to the fascia. Fascial incision was made and extended transversely with the Mayo scissors. The fascia was separated from the underlying rectus tissue superiorly and inferiorly. The peritoneum was identified and entered bluntly. Peritoneal incision was extended longitudinally. A low transverse hysterotomy was made. The fetus was delivered atraumatically. The umbilical cord was clamped x2 and cut and the infant was handed to the awaiting pediatricians. The placenta was removed intact and appeared normal with a 3-vessel cord. The uterus was exteriorized and cleared of all clot and debris. The hysterotomy was closed with running sutures of 0 Vicryl suture. A second imbricating layer was placed with the same suture. Excellent hemostasis was  observed.  The uterus was returned to the abdomen. The pelvis was irrigated and again, excellent hemostasis was noted. The peritoneum was closed with a running stitch of 2-0 Vicryl. The On Q Pain pump System was then placed.  Trocars were placed through the abdominal wall into the subfascial space and these were used to thread the silver soaker cathaters into place.The rectus muscles were inspected and were hemostatic. The rectus fascia was then reapproximated with running sutures of 0-vicryl, with careful placement not to incorporate the cathaters. Subcutaneous tissues are then irrigated with saline and hemostasis assured with the bovie. The subcutaneous fat was approximated with 3-0 plain and a running stitch.  The skin was closed with 4-0 monocryl suture in a subcuticular fashion followed by skin adhesive. The cathaters are flushed each with 5 mL of Bupivicaine and stabilized into place with dressing. Instrument, sponge, and needle counts were correct prior to the abdominal closure and at the conclusion of the case.  The patient tolerated the procedure well and was transferred to the recovery room in stable condition.   Natale Milch MD [facility], [facility] 11/01/19 4:15 PM

## 2019-11-01 NOTE — Transfer of Care (Signed)
Immediate Anesthesia Transfer of Care Note  Patient: Cindy Rodriguez  Procedure(s) Performed: CESAREAN SECTION (N/A )  Patient Location: PACU  Anesthesia Type:General  Level of Consciousness: awake, alert  and oriented  Airway & Oxygen Therapy: Patient Spontanous Breathing  Post-op Assessment: Report given to RN  Post vital signs: Reviewed and stable  Last Vitals:  Vitals Value Taken Time  BP 96/53 11/01/19 1638  Temp 36.8 C 11/01/19 1638  Pulse 64 11/01/19 1638  Resp    SpO2 100 % 11/01/19 1638    Last Pain:  Vitals:   11/01/19 1638  TempSrc: Oral  PainSc: 0-No pain         Complications: No apparent anesthesia complications

## 2019-11-01 NOTE — Anesthesia Procedure Notes (Signed)
Spinal  Patient location during procedure: OB Start time: 11/01/2019 3:00 PM End time: 11/01/2019 3:05 PM Staffing Performed: anesthesiologist  Anesthesiologist: Arita Miss, MD Preanesthetic Checklist Completed: patient identified, IV checked, site marked, risks and benefits discussed, surgical consent, monitors and equipment checked, pre-op evaluation and timeout performed Spinal Block Patient position: sitting Prep: ChloraPrep Patient monitoring: heart rate, continuous pulse ox, blood pressure and cardiac monitor Approach: midline Location: L3-4 Injection technique: single-shot Needle Needle type: Whitacre and Introducer  Needle gauge: 24 G Needle length: 9 cm Assessment Sensory level: T10 Additional Notes Negative paresthesia. Negative blood return. Positive free-flowing CSF. Expiration date of kit checked and confirmed. Patient tolerated procedure well, without complications.

## 2019-11-01 NOTE — Anesthesia Preprocedure Evaluation (Signed)
Anesthesia Evaluation  Patient identified by MRN, date of birth, ID band Patient awake    Reviewed: Allergy & Precautions, NPO status , Patient's Chart, lab work & pertinent test results  History of Anesthesia Complications Negative for: history of anesthetic complications  Airway Mallampati: III  TM Distance: >3 FB Neck ROM: Full    Dental no notable dental hx. (+) Teeth Intact   Pulmonary neg pulmonary ROS, neg shortness of breath, neg sleep apnea, neg COPD, Patient abstained from smoking.Not current smoker, former smoker,    Pulmonary exam normal breath sounds clear to auscultation       Cardiovascular Exercise Tolerance: Good METS(-) hypertension(-) CAD and (-) Past MI negative cardio ROS  (-) dysrhythmias  Rhythm:Regular Rate:Normal - Systolic murmurs    Neuro/Psych negative neurological ROS  negative psych ROS   GI/Hepatic neg GERD  ,(+)     (-) substance abuse  ,   Endo/Other  neg diabetes  Renal/GU negative Renal ROS     Musculoskeletal   Abdominal   Peds  Hematology   Anesthesia Other Findings Past Medical History: 03/27/2019: Dyspnea     Comment:  + Covid-19 (03/23/2019)  Reproductive/Obstetrics (+) Pregnancy Poor prenatal care                             Anesthesia Physical Anesthesia Plan  ASA: III  Anesthesia Plan: Spinal   Post-op Pain Management:    Induction:   PONV Risk Score and Plan: 4 or greater and Ondansetron and Dexamethasone  Airway Management Planned: Natural Airway  Additional Equipment:   Intra-op Plan:   Post-operative Plan:   Informed Consent: I have reviewed the patients History and Physical, chart, labs and discussed the procedure including the risks, benefits and alternatives for the proposed anesthesia with the patient or authorized representative who has indicated his/her understanding and acceptance.       Plan Discussed with:  CRNA and Surgeon  Anesthesia Plan Comments: (Dr Jerene Pitch [facility]) has classified this C/S as "urgent" prompting me to likely need to break from supervision temporarily and begin the case myself while the backup team arrives.  The patient last had a cajun biscuit at 9am, informed about increased risk of vomiting/aspiration.  Discussed R/B/A of neuraxial anesthesia technique with patient: - rare risks of spinal/epidural hematoma, nerve damage, infection - Risk of PDPH - Risk of itching - Risk of nausea and vomiting - Risk of conversion to general anesthesia and its associated risks, including sore throat, damage to lips/teeth/oropharynx, and rare risks such as cardiac and respiratory events. - Risk of surgical bleeding requiring blood products Patient voiced understanding. Patient informed about increased incidence of above perioperative risk due to high BMI. Patient understands.  )        Anesthesia Quick Evaluation

## 2019-11-01 NOTE — Progress Notes (Signed)
Patient has been having some recurrent decelerations with attempts with induction. Infant was initially tolerating labor, but began having decelerations around 11:30 am. She was given terbutaline and the infant recovered. She was started on 2 units of pitocin, but the decelerations reoccurred and became recurrent. She was again given terbutaline. Discussed the situation with the patient in detail. Discussed that the infant was displaying signs of intolerance of labor and recommended proceeding with a cesarean section for fetal intolerance. Patient initially refused a cesarean section. She feels upset and frustrated. She does not feel like she would do well with staples or sutures. She is nervous about recovering from a cesarean.  After consideration she was willing to consent to the procedure.   Adelene Idler MD [facility], [facility] 11/01/2019 2:26 PM

## 2019-11-02 LAB — CBC
HCT: 30.2 % — ABNORMAL LOW (ref 36.0–46.0)
Hemoglobin: 10.4 g/dL — ABNORMAL LOW (ref 12.0–15.0)
MCH: 29 pg (ref 26.0–34.0)
MCHC: 34.4 g/dL (ref 30.0–36.0)
MCV: 84.1 fL (ref 80.0–100.0)
Platelets: 189 10*3/uL (ref 150–400)
RBC: 3.59 MIL/uL — ABNORMAL LOW (ref 3.87–5.11)
RDW: 14 % (ref 11.5–15.5)
WBC: 20.4 10*3/uL — ABNORMAL HIGH (ref 4.0–10.5)
nRBC: 0 % (ref 0.0–0.2)

## 2019-11-02 LAB — RPR: RPR Ser Ql: NONREACTIVE

## 2019-11-02 MED ORDER — SENNOSIDES 8.8 MG/5ML PO SYRP
10.0000 mL | ORAL_SOLUTION | Freq: Two times a day (BID) | ORAL | Status: DC
  Administered 2019-11-02 – 2019-11-04 (×4): 10 mL via ORAL
  Filled 2019-11-02 (×6): qty 10

## 2019-11-02 MED ORDER — FERROUS SULFATE 220 (44 FE) MG/5ML PO ELIX
330.0000 mg | ORAL_SOLUTION | Freq: Two times a day (BID) | ORAL | Status: DC
  Administered 2019-11-02 – 2019-11-03 (×2): 330 mg via ORAL
  Filled 2019-11-02 (×3): qty 7.5

## 2019-11-02 MED ORDER — IBUPROFEN 100 MG/5ML PO SUSP
800.0000 mg | Freq: Three times a day (TID) | ORAL | Status: DC
  Filled 2019-11-02: qty 40

## 2019-11-02 MED ORDER — OXYCODONE HCL 5 MG/5ML PO SOLN: 5.0000 mg | ORAL | Status: DC | PRN

## 2019-11-02 MED ORDER — DIPHENHYDRAMINE HCL 50 MG/ML IJ SOLN: 25.0000 mg | Freq: Four times a day (QID) | INTRAMUSCULAR | Status: DC | PRN

## 2019-11-02 MED ORDER — ACETAMINOPHEN 160 MG/5ML PO SUSP
ORAL | Status: AC
  Filled 2019-11-02: qty 5

## 2019-11-02 MED ORDER — IBUPROFEN 100 MG/5ML PO SUSP
800.0000 mg | Freq: Three times a day (TID) | ORAL | Status: DC
  Administered 2019-11-02 – 2019-11-03 (×2): 800 mg via ORAL
  Filled 2019-11-02 (×8): qty 40

## 2019-11-02 MED ORDER — ADULT MULTIVITAMIN LIQUID CH
15.0000 mL | Freq: Every day | ORAL | Status: DC
  Administered 2019-11-02: 15 mL via ORAL
  Filled 2019-11-02 (×2): qty 15

## 2019-11-02 MED ORDER — ACETAMINOPHEN 160 MG/5ML PO SOLN
1000.0000 mg | Freq: Four times a day (QID) | ORAL | Status: DC
  Administered 2019-11-02 – 2019-11-04 (×7): 1000 mg via ORAL
  Filled 2019-11-02 (×13): qty 40.6

## 2019-11-02 NOTE — Progress Notes (Signed)
Subjective:   She is feeling well. She reports that she does not swallow pills.  She has been taking just Toradol for pain. She has not no complaints. Foley is in place. She has not yet ambulated. She has tolerated a regular diet.   Objective:  Blood pressure (!) 103/52, pulse 61, temperature 97.9 F (36.6 C), temperature source Oral, resp. rate 18, height 5\' 1"  (1.549 m), weight 110.7 kg, last menstrual period 01/25/2019, SpO2 93 %, unknown if currently breastfeeding.  General: NAD Pulmonary: no increased work of breathing Abdomen: non-distended, non-tender, fundus firm at level of umbilicus Incision: Extremities: no edema, no erythema, no tenderness  Results for orders placed or performed during the hospital encounter of 11/01/19 (from the past 72 hour(s))  CBC     Status: Abnormal   Collection Time: 11/01/19 10:02 AM  Result Value Ref Range   WBC 18.6 (H) 4.0 - 10.5 K/uL   RBC 3.74 (L) 3.87 - 5.11 MIL/uL   Hemoglobin 11.0 (L) 12.0 - 15.0 g/dL   HCT 01/01/20 (L) 20.2 - 54.2 %   MCV 84.2 80.0 - 100.0 fL   MCH 29.4 26.0 - 34.0 pg   MCHC 34.9 30.0 - 36.0 g/dL   RDW 70.6 23.7 - 62.8 %   Platelets 219 150 - 400 K/uL   nRBC 0.0 0.0 - 0.2 %    Comment: Performed at [facility], [address] [place], [place]  Type and screen [facility]     Status: None   Collection Time: 11/01/19 10:02 AM  Result Value Ref Range   ABO/RH(D) O POS    Antibody Screen NEG    Sample Expiration      11/04/2019,2359 Performed at [facility], [address] [place], [place]   Rapid HIV screen (HIV 1/2 Ab+Ag) ([facility] Only)     Status: None   Collection Time: 11/01/19 10:02 AM  Result Value Ref Range   HIV-1 P24 Antigen - HIV24 NON REACTIVE NON REACTIVE    Comment: (NOTE) Detection of p24 may be inhibited by biotin in the sample, causing false negative results in acute infection.    HIV 1/2 Antibodies NON REACTIVE NON REACTIVE   Interpretation (HIV Ag Ab)      A non reactive test result means that HIV 1 or HIV 2 antibodies and HIV 1 p24 antigen were not detected in the specimen.    Comment: Performed at [facility], [address] [place], [place]  ABO/Rh     Status: None   Collection Time: 11/01/19 12:04 PM  Result Value Ref Range   ABO/RH(D)      O POS Performed at [facility], [address] [place], [place]   Urine Drug Screen, [facility] only)     Status: Abnormal   Collection Time: 11/01/19  7:08 PM  Result Value Ref Range   Tricyclic, Ur Screen NONE DETECTED NONE DETECTED   Amphetamines, Ur Screen NONE DETECTED NONE DETECTED   MDMA (Ecstasy)Ur Screen NONE DETECTED NONE DETECTED   Cocaine Metabolite,Ur [place] NONE DETECTED NONE DETECTED   Opiate, Ur Screen NONE DETECTED NONE DETECTED   Phencyclidine (PCP) Ur S NONE DETECTED NONE DETECTED   Cannabinoid 50 Ng, Ur [place] POSITIVE (A) NONE DETECTED   Barbiturates, Ur Screen NONE DETECTED NONE DETECTED   Benzodiazepine, Ur Scrn NONE DETECTED NONE DETECTED   Methadone Scn, Ur NONE DETECTED NONE DETECTED    Comment: (NOTE) Tricyclics +  metabolites, urine    Cutoff 1000 ng/mL Amphetamines + metabolites, urine  Cutoff 1000 ng/mL MDMA (Ecstasy), urine              Cutoff 500 ng/mL Cocaine Metabolite, urine          Cutoff 300 ng/mL Opiate + metabolites, urine        Cutoff 300 ng/mL Phencyclidine (PCP), urine         Cutoff 25 ng/mL Cannabinoid, urine                 Cutoff 50 ng/mL Barbiturates + metabolites, urine  Cutoff 200 ng/mL Benzodiazepine, urine              Cutoff 200 ng/mL Methadone, urine                   Cutoff 300 ng/mL The urine drug screen provides only a preliminary, unconfirmed analytical test result and should not be used for non-medical purposes. Clinical consideration and professional judgment should be applied to any positive drug screen result due to possible interfering substances. A more  specific alternate chemical method must be used in order to obtain a confirmed analytical result. Gas chromatography / mass spectrometry (GC/MS) is the preferred confirmat ory method. Performed at [facility], [address], [place], [place] [address]      Assessment:   24 y.o. [phone] postoperativeday # 1   Plan:  1) Acute blood loss anemia - hemodynamically stable and asymptomatic - po ferrous sulfate  2) Blood Type --/--/O POS Performed at [facility], [address] [place], [place] [address]  (04/10 1204) / Rubella 1.18 (10/12 1030) / Varicella pending,  3) TDAP: up to date   4) Bottle feeding   5) Contraception  6) Called and spoke with pharmacy- they assisted with changing her medications to liquid form.   7) Continue postpartum care  Adrian Prows MD [facility], [facility] 11/02/2019 7:17 AM

## 2019-11-02 NOTE — Anesthesia Postprocedure Evaluation (Signed)
Anesthesia Post Note  Patient: Cindy Rodriguez  Procedure(s) Performed: CESAREAN SECTION (N/A )  Patient location during evaluation: Mother Baby Anesthesia Type: Spinal Level of consciousness: awake and alert Pain management: pain level controlled Vital Signs Assessment: post-procedure vital signs reviewed and stable Respiratory status: spontaneous breathing, nonlabored ventilation and respiratory function stable Cardiovascular status: stable Postop Assessment: no headache, no backache, able to ambulate, adequate PO intake and no apparent nausea or vomiting Anesthetic complications: no     Last Vitals:  Vitals:   11/02/19 1100 11/02/19 1228  BP:  (!) 98/56  Pulse:  65  Resp:  20  Temp:  36.7 C  SpO2: 94% 98%    Last Pain:  Vitals:   11/02/19 1228  TempSrc: Oral  PainSc:                  [name]

## 2019-11-02 NOTE — Anesthesia Post-op Follow-up Note (Signed)
  Anesthesia Pain Follow-up Note  Patient: Cindy Rodriguez  Day #: 1  Date of Follow-up: 11/02/2019 Time: 4:03 PM  Last Vitals:  Vitals:   11/02/19 1100 11/02/19 1228  BP:  (!) 98/56  Pulse:  65  Resp:  20  Temp:  36.7 C  SpO2: 94% 98%    Level of Consciousness: alert  Pain: mild   Side Effects:None  Catheter Site Exam:clean, dry     Plan: D/C from anesthesia care at surgeon's request  Lenard Rodriguez

## 2019-11-03 ENCOUNTER — Ambulatory Visit: Payer: Medicaid Other

## 2019-11-03 LAB — VARICELLA ZOSTER ANTIBODY, IGG: Varicella IgG: 211 {index} (ref >165)

## 2019-11-03 MED ORDER — FERROUS SULFATE 220 (44 FE) MG/5ML PO ELIX
330.0000 mg | ORAL_SOLUTION | Freq: Every day | ORAL | Status: DC
  Administered 2019-11-04: 330 mg via ORAL
  Filled 2019-11-03: qty 7.5

## 2019-11-03 MED ORDER — IBUPROFEN 100 MG/5ML PO SUSP
800.0000 mg | Freq: Three times a day (TID) | ORAL | Status: DC
  Administered 2019-11-03 (×2): 800 mg via ORAL
  Filled 2019-11-03: qty 40

## 2019-11-03 MED ORDER — PRENATAL MULTIVITAMIN CH: 1.0000 | ORAL_TABLET | Freq: Every day | ORAL | Status: DC

## 2019-11-03 NOTE — Clinical Social Work Maternal (Signed)
CLINICAL SOCIAL WORK MATERNAL/CHILD NOTE  Patient Details  Name: Cindy Rodriguez MRN: [id number] Date of Birth: 1995/10/09  Date:  11/03/2019  Clinical Social Worker Initiating Note:  Oleh Genin, LCSW Date/Time: Initiated:  11/03/19/      Child's Name:  Cindy Rodriguez   Biological Parents:  Mother, Father(FOB [place] Jack with limited involvement per MOB)   Need for Interpreter:  None   Reason for Referral:  Current Substance Use/Substance Use During Pregnancy    Address:  [place] [address]    Phone number:  [phone] (home)     Additional phone number: None reported  Household Members/Support Persons (HM/SP):   Household Member/Support Person 1   HM/SP Name Relationship DOB or Age  HM/SP -61 Antoinette Mother Unknown  HM/SP -2        HM/SP -3        HM/SP -4        HM/SP -5        HM/SP -6        HM/SP -7        HM/SP -8          Natural Supports (not living in the home):  Immediate Family, Parent, Extended Family, Medical laboratory scientific officer, Friends   Chiropodist:     Employment: Part-time   Type of Work: Music therapist, 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  Strengths:  Ability to meet basic needs , Compliance with medical plan , Home prepared for child , Pediatrician chosen   Psychotropic Medications:         Pediatrician:    [facility]  Pediatrician List:   [place]      Pediatrician Fax Number:    Risk Factors/Current Problems:  Substance Use    Cognitive State:  Alert , Goal Oriented    Mood/Affect:  Calm , Happy    CSW Assessment: CSW consulted due to positive drug  screen for MOB and Baby. CSW met with MOB at bedside. Explained CSW role and reason for referral. Explained Drug Screen/CPS Report policy and MOB verbalized understanding. MOB denied any substance use. She reported FOB smokes marijuana and she thinks she was exposed to second hand smoke.   MOB reported she is feeling "ok" post delivery. MOB reported she lives with her Mother and feels she has a good support system to help her care for Baby including her Mother and 5 sisters. MOB reported FOB is involved but involvement is limited.   MOB reported she receives [facility] and [employer] and has already reached out to her Workers to inform them of 61 birth. MOB reported she has a new car seat, crib, and all other Items needed for Baby. MOB stated she drives and can provide transport for Baby. MOB plans to use [employer] for Baby's medical care.  MOB denied any current or past mental health concerns or treatments. She denied SI, HI, or DV. CSW provided education and information sheets regarding PPD and SIDS and MOB verbalized understanding. MOB was encouraged to reach out to her Provider with any questions or needs.   CSW attempted call to [facility] CPS to make report per policy due to Baby being positive for marijuana. Left a voicemail requesting a return call from CPS.   CSW encouraged MOB to reach out with any questions or needs for support. She agreed.  CSW Plan/Description:  [place], CSW Will Continue to Monitor Umbilical Cord Tissue Drug Screen Results and Make Report if Warranted, Child Protective Service Report , Perinatal Mood and Anxiety Disorder (PMADs) Education, Sudden Infant Death Syndrome (SIDS) Education    Belita Warsame E Roselyne Stalnaker, LCSW 11/03/2019, 1:20 PM

## 2019-11-03 NOTE — Progress Notes (Signed)
POD #2 Prom[place] Cesarean section for FITL. Pregnancy c/b FGR Subjective:  Feels well. Tolerating regular diet. Passed gas yesterday. Voiding without difficulty. Bottle feeding. Baby having car seat test now. Patient unsure wheter she wants to go home today.   Objective:  Blood pressure 106/72, pulse 68, temperature 98.5 F (36.9 C), temperature source Oral, resp. rate 18, height 5\' 1"  (1.549 m), weight 110.7 kg, last menstrual period 01/25/2019, SpO2 100 %, unknown if currently breastfeeding.  General: BF in NAD Pulmonary: no increased work of breathing/ CTAB Abdomen: non-distended, non-tender, NABS present. FF at U-3/ML/NT Large abdominal pad over ON-Q catheters due to leakage are wet. Dressing removed and ON Q pump dressing reapplied after using Dermabond at base of catheters.  Incision: Honeycomb dressing intact Extremities: no edema, no erythema, no tenderness  Results for orders placed or performed during the hospital encounter of 11/01/19 (from the past 72 hour(s))  CBC     Status: Abnormal   Collection Time: 11/01/19 10:02 AM  Result Value Ref Range   WBC 18.6 (H) 4.0 - 10.5 K/uL   RBC 3.74 (L) 3.87 - 5.11 MIL/uL   Hemoglobin 11.0 (L) 12.0 - 15.0 g/dL   HCT 31.5 (L) 36.0 - 46.0 %   MCV 84.2 80.0 - 100.0 fL   MCH 29.4 26.0 - 34.0 pg   MCHC 34.9 30.0 - 36.0 g/dL   RDW 13.8 11.5 - 15.5 %   Platelets 219 150 - 400 K/uL   nRBC 0.0 0.0 - 0.2 %    Comment: Performed at [facility], [address] [place], [place] [address]  Type and screen [place]     Status: None   Collection Time: 11/01/19 10:02 AM  Result Value Ref Range   ABO/RH(D) O POS    Antibody Screen NEG    Sample Expiration      11/04/2019,2359 Performed at [facility], [address] [place], [place] [address]   RPR     Status: None   Collection Time: 11/01/19 10:02 AM  Result Value Ref Range   RPR Ser Ql NON REACTIVE NON REACTIVE    Comment: Performed at [facility], [address] [place], [place] [address]  Rapid HIV screen (HIV 1/2 Ab+Ag) ([facility] Only)     Status: None   Collection Time: 11/01/19 10:02 AM  Result Value Ref Range   HIV-1 P24 Antigen - HIV24 NON REACTIVE NON REACTIVE    Comment: (NOTE) Detection of p24 may be inhibited by biotin in the sample, causing false negative results in acute infection.    HIV 1/2 Antibodies NON REACTIVE NON REACTIVE   Interpretation (HIV Ag Ab)      A non reactive test result means that HIV 1 or HIV 2 antibodies and HIV 1 p24 antigen were not detected in the specimen.    Comment: Performed at [facility], [address] [place], [place] [address]  ABO/Rh     Status: None   Collection Time: 11/01/19 12:04 PM  Result Value Ref Range   ABO/RH(D)      O POS Performed at [facility], [address] [place], [place] [address]   Urine Drug Screen, [facility] [place] only)     Status: Abnormal   Collection Time: 11/01/19  7:08 PM  Result Value Ref Range   Tricyclic, Ur Screen NONE DETECTED NONE DETECTED   Amphetamines, Ur Screen NONE DETECTED NONE DETECTED   MDMA (Ecstasy)Ur Screen NONE DETECTED NONE  DETECTED   Cocaine Metabolite,Ur [place] NONE DETECTED NONE DETECTED   Opiate, Ur Screen NONE DETECTED NONE DETECTED   Phencyclidine (PCP) Ur S NONE DETECTED NONE DETECTED   Cannabinoid 50 Ng, Ur [place] POSITIVE (A) NONE DETECTED   Barbiturates, Ur Screen NONE DETECTED NONE DETECTED   Benzodiazepine, Ur Scrn NONE DETECTED NONE DETECTED   Methadone Scn, Ur NONE DETECTED NONE DETECTED    Comment: (NOTE) Tricyclics + metabolites, urine    Cutoff 1000 ng/mL Amphetamines + metabolites, urine  Cutoff 1000 ng/mL MDMA (Ecstasy), urine              Cutoff 500 ng/mL Cocaine Metabolite, urine          Cutoff 300 ng/mL Opiate + metabolites, urine        Cutoff 300 ng/mL Phencyclidine (PCP), urine         Cutoff 25 ng/mL Cannabinoid, urine                 Cutoff 50 ng/mL Barbiturates +  metabolites, urine  Cutoff 200 ng/mL Benzodiazepine, urine              Cutoff 200 ng/mL Methadone, urine                   Cutoff 300 ng/mL The urine drug screen provides only a preliminary, unconfirmed analytical test result and should not be used for non-medical purposes. Clinical consideration and professional judgment should be applied to any positive drug screen result due to possible interfering substances. A more specific alternate chemical method must be used in order to obtain a confirmed analytical result. Gas chromatography / mass spectrometry (GC/MS) is the preferred confirmat ory method. Performed at [facility], [address] [place], [place] [address]   CBC     Status: Abnormal   Collection Time: 11/02/19  6:53 AM  Result Value Ref Range   WBC 20.4 (H) 4.0 - 10.5 K/uL   RBC 3.59 (L) 3.87 - 5.11 MIL/uL   Hemoglobin 10.4 (L) 12.0 - 15.0 g/dL   HCT 37.6 (L) 28.3 - 15.1 %   MCV 84.1 80.0 - 100.0 fL   MCH 29.0 26.0 - 34.0 pg   MCHC 34.4 30.0 - 36.0 g/dL   RDW 76.1 60.7 - 37.1 %   Platelets 189 150 - 400 K/uL   nRBC 0.0 0.0 - 0.2 %    Comment: Performed at [facility], [address], [place], [place] [address]     Assessment:   24 y.o. [phone] postoperativeday # 2-stable  Continue post operative and postpartum care  Increase ambulation   Plan:  1) Mild anemia - hemodynamically stable and asymptomatic - po ferrous sulfate/ vitamins  2)O POS/ RI  3) TDAP given 09/24/19  4) Bottle/Contraception-condoms  5) Disposition-possible discharge later today or tomorrow  Farrel Conners, CNM

## 2019-11-03 NOTE — Progress Notes (Addendum)
CSW received a return call from [employer]. CPS report made. Informed Clide Deutscher of plan for Baby and MOB to possibly discharge today. Asked for a return call if there are any barriers to discharge. Clide Deutscher reported she does not expect there to be any barriers, but will have a Worker call if any arise.    Cindy Rodriguez, [place] [phone]

## 2019-11-03 NOTE — Lactation Note (Signed)
This note was copied from a baby's chart. Lactation Consultation Note  Patient Name: Cindy Rodriguez [id number] Date: 11/03/2019   This is mom's first baby.  Mom tested (+) for marijuana 05/05/19 and 09/24/19.  Baby was MJ (+).  Mom has no desire to breast feed or pump.  Hand out given on infant formula preparation and reviewed.  Explained how to dry up her breast milk.  Discussed differences, management and treatment of full breasts, engorgement, plugged ducts and mastitis.  Mom acknowledges understanding even though she kept answering and talking on the telephone during the teaching.    Maternal Data    Feeding Feeding Type: Formula Nipple Type: Slow - flow  LATCH Score                   Interventions    Lactation Tools Discussed/Used     Consult Status      Louis Meckel 11/03/2019, 6:01 PM

## 2019-11-04 LAB — SURGICAL PATHOLOGY

## 2019-11-04 MED ORDER — OXYCODONE HCL 5 MG PO TABS: 5.0000 mg | ORAL_TABLET | Freq: Four times a day (QID) | ORAL | 0 refills | Status: DC | PRN

## 2019-11-04 MED ORDER — IBUPROFEN 100 MG/5ML PO SUSP: 800.0000 mg | Freq: Three times a day (TID) | ORAL | 1 refills | Status: DC

## 2019-11-04 MED ORDER — FERROUS SULFATE 220 (44 FE) MG/5ML PO ELIX: 330.0000 mg | ORAL_SOLUTION | Freq: Every day | ORAL | 3 refills | Status: DC

## 2019-11-04 NOTE — Progress Notes (Signed)
Pt discharged with infant.  Discharge instructions, prescriptions and follow up appointment given to and reviewed with pt. Pt verbalized understanding. Escorted out by auxillary. 

## 2019-11-04 NOTE — Discharge Instructions (Signed)
Postpartum Care After Cesarean Delivery This sheet gives you information about how to care for yourself from the time you deliver your baby to up to 6-12 weeks after delivery (postpartum period). Your health care provider may also give you more specific instructions. If you have problems or questions, contact your health care provider. Follow these instructions at home: Medicines  Take over-the-counter and prescription medicines only as told by your health care provider.  If you were prescribed an antibiotic medicine, take it as told by your health care provider. Do not stop taking the antibiotic even if you start to feel better.  Ask your health care provider if the medicine prescribed to you: ? Requires you to avoid driving or using heavy machinery. ? Can cause constipation. You may need to take actions to prevent or treat constipation, such as:  Drink enough fluid to keep your urine pale yellow.  Take over-the-counter or prescription medicines.  Eat foods that are high in fiber, such as beans, whole grains, and fresh fruits and vegetables.  Limit foods that are high in fat and processed sugars, such as fried or sweet foods. Activity  Gradually return to your normal activities as told by your health care provider.  Avoid activities that take a lot of effort and energy (are strenuous) until approved by your health care provider. Walking at a slow to moderate pace is usually safe. Ask your health care provider what activities are safe for you. ? Do not lift anything that is heavier than your baby or 10 lb (4.5 kg) as told by your health care provider. ? Do not vacuum, climb stairs, or drive a car for as long as told by your health care provider.  If possible, have someone help you at home until you are able to do your usual activities yourself.  Rest as much as possible. Try to rest or take naps while your baby is sleeping. Vaginal bleeding  It is normal to have vaginal bleeding  (lochia) after delivery. Wear a sanitary pad to absorb vaginal bleeding and discharge. ? During the first week after delivery, the amount and appearance of lochia is often similar to a menstrual period. ? Over the next few weeks, it will gradually decrease to a dry, yellow-brown discharge. ? For most women, lochia stops completely by 4-6 weeks after delivery. Vaginal bleeding can vary from woman to woman.  Change your sanitary pads frequently. Watch for any changes in your flow, such as: ? A sudden increase in volume. ? A change in color. ? Large blood clots.  If you pass a blood clot, save it and call your health care provider to discuss. Do not flush blood clots down the toilet before you get instructions from your health care provider.  Do not use tampons or douches until your health care provider says this is safe.  If you are not breastfeeding, your period should return 6-8 weeks after delivery. If you are breastfeeding, your period may return anytime between 8 weeks after delivery and the time that you stop breastfeeding. Perineal care   If your C-section (Cesarean section) was unplanned, and you were allowed to labor and push before delivery, you may have pain, swelling, and discomfort of the tissue between your vaginal opening and your anus (perineum). You may also have an incision in the tissue (episiotomy) or the tissue may have torn during delivery. Follow these instructions as told by your health care provider: ? Keep your perineum clean and dry as told by   your health care provider. Use medicated pads and pain-relieving sprays and creams as directed. ? If you have an episiotomy or vaginal tear, check the area every day for signs of infection. Check for:  Redness, swelling, or pain.  Fluid or blood.  Warmth.  Pus or a bad smell. ? You may be given a squirt bottle to use instead of wiping to clean the perineum area after you go to the bathroom. As you start healing, you may use  the squirt bottle before wiping yourself. Make sure to wipe gently. ? To relieve pain caused by an episiotomy, vaginal tear, or hemorrhoids, try taking a warm sitz bath 2-3 times a day. A sitz bath is a warm water bath that is taken while you are sitting down. The water should only come up to your hips and should cover your buttocks. Breast care  Within the first few days after delivery, your breasts may feel heavy, full, and uncomfortable (breast engorgement). You may also have milk leaking from your breasts. Your health care provider can suggest ways to help relieve breast discomfort. Breast engorgement should go away within a few days.  If you are breastfeeding: ? Wear a bra that supports your breasts and fits you well. ? Keep your nipples clean and dry. Apply creams and ointments as told by your health care provider. ? You may need to use breast pads to absorb milk leakage. ? You may have uterine contractions every time you breastfeed for several weeks after delivery. Uterine contractions help your uterus return to its normal size. ? If you have any problems with breastfeeding, work with your health care provider or a lactation consultant.  If you are not breastfeeding: ? Avoid touching your breasts as this can make your breasts produce more milk. ? Wear a well-fitting bra and use cold packs to help with swelling. ? Do not squeeze out (express) milk. This causes you to make more milk. Intimacy and sexuality  Ask your health care provider when you can engage in sexual activity. This may depend on your: ? Risk of infection. ? Healing rate. ? Comfort and desire to engage in sexual activity.  You are able to get pregnant after delivery, even if you have not had your period. If desired, talk with your health care provider about methods of family planning or birth control (contraception). Lifestyle  Do not use any products that contain nicotine or tobacco, such as cigarettes, e-cigarettes,  and chewing tobacco. If you need help quitting, ask your health care provider.  Do not drink alcohol, especially if you are breastfeeding. Eating and drinking   Drink enough fluid to keep your urine pale yellow.  Eat high-fiber foods every day. These may help prevent or relieve constipation. High-fiber foods include: ? Whole grain cereals and breads. ? Brown rice. ? Beans. ? Fresh fruits and vegetables.  Take your prenatal vitamins until your postpartum checkup or until your health care provider tells you it is okay to stop. General instructions  Keep all follow-up visits for you and your baby as told by your health care provider. Most women visit their health care provider for a postpartum checkup within the first 3-6 weeks after delivery. Contact a health care provider if you:  Feel unable to cope with the changes that a new baby brings to your life, and these feelings do not go away.  Feel unusually sad or worried.  Have breasts that are painful, hard, or turn red.  Have a fever.    Have trouble holding urine or keeping urine from leaking.  Have little or no interest in activities you used to enjoy.  Have not breastfed at all and you have not had a menstrual period for 12 weeks after delivery.  Have stopped breastfeeding and you have not had a menstrual period for 12 weeks after you stopped breastfeeding.  Have questions about caring for yourself or your baby.  Pass a blood clot from your vagina. Get help right away if you:  Have chest pain.  Have difficulty breathing.  Have sudden, severe leg pain.  Have severe pain or cramping in your abdomen.  Bleed from your vagina so much that you fill more than one sanitary pad in one hour. Bleeding should not be heavier than your heaviest period.  Develop a severe headache.  Faint.  Have blurred vision or spots in your vision.  Have a bad-smelling vaginal discharge.  Have thoughts about hurting yourself or your  baby. If you ever feel like you may hurt yourself or others, or have thoughts about taking your own life, get help right away. You can go to your nearest emergency department or call:  Your local emergency services (911 in the [place]  A suicide crisis helpline, such as the [place] at (601)257-8634. This is open 24 hours a day. Summary  The period of time from when you deliver your baby to up to 6-12 weeks after delivery is called the postpartum period.  Gradually return to your normal activities as told by your health care provider.  Keep all follow-up visits for you and your baby as told by your health care provider. This information is not intended to replace advice given to you by your health care provider. Make sure you discuss any questions you have with your health care provider. Document Revised: 02/27/2018 Document Reviewed: 02/27/2018 Elsevier Patient Education  [place]. Discharge Instructions:   Follow-up Appointment: 1 week, please call the office to schedule  If there are any new medications, they have been ordered and will be available for pickup at the listed pharmacy on your way home from the hospital.   Call the office if you have any of the following: headache, visual changes, fever >101.0 F, chills, shortness of breath, breast concerns, excessive vaginal bleeding, incision drainage or problems, leg pain or redness, depression or any other concerns. If you have vaginal discharge with an odor, let your doctor know.   It is normal to bleed for up to 6 weeks. You should not soak through more than 1 pad in 1 hour. If you have a blood clot larger than your fist with continued bleeding, call your doctor.   After a c-section, you should expect a small amount of blood or clear fluid coming from the incision and abdominal cramping/soreness. Inspect your incision site daily. Stand in front of a mirror to look for any redness, incision opening,  or discolored/odorness drainage. Take a shower daily and continue good hygiene. Use own towel and washcloth (do not share). Make sure your sheets on your bed are clean. No pets sleeping around your incision site. Dressing will be removed at your postpartum visit. If the dressing does become wet or soiled underneath, it is okay to remove it before your visit.    On-Q pump: You will remove on day 4 after insertion or if the ball becomes flat before day 4. You will remove on: 11/05/2019  Activity: Do not lift > 15 lbs for 6 weeks (  do not lift anything heavier than your baby). No intercourse, tampons, swimming pools, hot tubs, baths (only showers) for 6 weeks.  No driving for 1-2 weeks. Do not drive while taking narcotic or opioid pain medication.  Continue taking your prenatal vitamin, especially if breastfeeding. Increase calories and fluids (water) while breastfeeding.   Your milk will come in, in the next couple of days (right now it is colostrum). You may have a slight fever when your milk comes in, but it should go away on its own.  If it does not, and rises above 101 F please call the doctor. You will also feel achy and your breasts will be firm. They will also start to leak. If you are breastfeeding, continue as you have been and you can pump/express milk for comfort.   If you have too much milk, your breasts can become engorged, which could lead to mastitis. This is an infection of the milk ducts. It can be very painful and you will need to notify your doctor to obtain a prescription for antibiotics. You can also treat it with a shower or hot/cold compress.   For concerns about your baby, please call your pediatrician.  For breastfeeding concerns, the lactation consultant can be reached at [phone].   Postpartum blues (feelings of happy one minute and sad another minute) are normal for the first few weeks but if it gets worse let your doctor know.   Congratulations! We enjoyed caring for  you and your new bundle of joy!

## 2019-11-12 NOTE — Telephone Encounter (Signed)
Encounter opened in error

## 2019-11-14 ENCOUNTER — Other Ambulatory Visit: Payer: Self-pay

## 2019-11-14 ENCOUNTER — Ambulatory Visit (INDEPENDENT_AMBULATORY_CARE_PROVIDER_SITE_OTHER): Payer: Medicaid Other | Admitting: Obstetrics and Gynecology

## 2019-11-14 ENCOUNTER — Encounter: Payer: Self-pay | Admitting: Obstetrics and Gynecology

## 2019-11-14 VITALS — BP 120/70 | Ht 61.0 in | Wt 227.0 lb

## 2019-11-14 DIAGNOSIS — Z98891 History of uterine scar from previous surgery: Secondary | ICD-10-CM

## 2019-11-14 NOTE — Patient Instructions (Signed)

## 2019-11-14 NOTE — Progress Notes (Signed)
  OBSTETRICS POSTPARTUM CLINIC PROGRESS NOTE  Subjective:     Cindy Rodriguez is a 24 y.o. G24P1011 female who presents for a postpartum visit. She is 2 weeks postpartum following a Term pregnancy and delivery by C-section.  I have fully reviewed the prenatal and intrapartum course. Anesthesia: epidural.  Postpartum course has been complicated by uncomplicated.  Baby is feeding by Bottle.  Bleeding: patient has not  resumed menses.  Bowel function is normal. Bladder function is normal.  Patient is not sexually active. Contraception method desired is none.  Postpartum depression screening: negative.  The following portions of the patient's history were reviewed and updated as appropriate: allergies, current medications, past family history, past medical history, past social history, past surgical history and problem list.  Review of Systems Pertinent items are noted in HPI.  Objective:    BP 120/70   Ht 5\' 1"  (1.549 m)   Wt 227 lb (103 kg)   Breastfeeding No   BMI 42.89 kg/m   General:  alert and no distress   Breasts:  inspection negative, no nipple discharge or bleeding, no masses or nodularity palpable  Lungs: clear to auscultation bilaterally  Heart:  regular rate and rhythm, S1, S2 normal, no murmur, click, rub or gallop  Abdomen: soft, non-tender; bowel sounds normal; no masses,  no organomegaly.  Well healed Pfannenstiel incision   Vulva:  normal  Vagina: normal vagina, no discharge, exudate, lesion, or erythema  Cervix:  no cervical motion tenderness and no lesions  Corpus: normal size, contour, position, consistency, mobility, non-tender  Adnexa:  normal adnexa and no mass, fullness, tenderness  Rectal Exam: Not performed.         Assessment:  Post Partum Care visit 1. S/P cesarean section Doing well, no issues   Plan:  See orders and Patient Instructions Follow up in: 4 weeks or as needed.   MD [facility], [facility]  11/14/2019 2:35 PM

## 2019-12-12 ENCOUNTER — Ambulatory Visit: Payer: Medicaid Other | Admitting: Obstetrics and Gynecology

## 2019-12-15 ENCOUNTER — Ambulatory Visit (INDEPENDENT_AMBULATORY_CARE_PROVIDER_SITE_OTHER): Payer: Medicaid Other | Admitting: Obstetrics and Gynecology

## 2019-12-15 ENCOUNTER — Other Ambulatory Visit: Payer: Self-pay

## 2019-12-15 ENCOUNTER — Encounter: Payer: Self-pay | Admitting: Obstetrics and Gynecology

## 2019-12-15 VITALS — BP 106/68 | Ht 61.0 in | Wt 227.0 lb

## 2019-12-15 DIAGNOSIS — Z98891 History of uterine scar from previous surgery: Secondary | ICD-10-CM

## 2019-12-15 NOTE — Progress Notes (Signed)
  OBSTETRICS POSTPARTUM CLINIC PROGRESS NOTE  Subjective:     Cindy Rodriguez is a 24 y.o. G80P1011 female who presents for a postpartum visit. She is 6 weeks postpartum following a Term pregnancy and delivery by C-section.  I have fully reviewed the prenatal and intrapartum course. Anesthesia: epidural.  Postpartum course has been complicated by uncomplicated.  Baby is feeding by Bottle.  Bleeding: patient has  resumed menses.  Bowel function is normal. Bladder function is normal.  Patient is sexually active. Contraception method desired is none.  Discussed pregnancy spacing of 12 months.  Postpartum depression screening: negative.   The following portions of the patient's history were reviewed and updated as appropriate: allergies, current medications, past family history, past medical history, past social history, past surgical history and problem list.  Review of Systems Pertinent items are noted in HPI.  Objective:    BP 106/68   Ht 5\' 1"  (1.549 m)   Wt 227 lb (103 kg)   LMP 12/08/2019   Breastfeeding No   BMI 42.89 kg/m   General:  alert and no distress   Breasts:  inspection negative, no nipple discharge or bleeding, no masses or nodularity palpable  Lungs: clear to auscultation bilaterally  Heart:  regular rate and rhythm, S1, S2 normal, no murmur, click, rub or gallop  Abdomen: soft, non-tender; bowel sounds normal; no masses,  no organomegaly.   Well healed Pfannenstiel incision                            Assessment:  Post Partum Care visit 1. S/P cesarean section    2. Postpartum care and examination     Plan:  See orders and Patient Instructions Follow up in: 12 months or as needed.   12/10/2019 MD, [facility], [facility] 12/15/2019 11:58 AM

## 2020-01-05 ENCOUNTER — Telehealth: Payer: Self-pay | Admitting: Obstetrics and Gynecology

## 2020-01-05 ENCOUNTER — Other Ambulatory Visit: Payer: Self-pay | Admitting: Obstetrics and Gynecology

## 2020-01-05 NOTE — Telephone Encounter (Signed)
Patient is aware 

## 2020-01-05 NOTE — Telephone Encounter (Signed)
Fax sent- please notify patient.

## 2020-01-05 NOTE — Telephone Encounter (Signed)
Patient is calling needing an Return to work letter sent to [facility] urgent care fax number [phone] Cindy Rodriguez

## 2020-02-23 ENCOUNTER — Other Ambulatory Visit: Payer: Self-pay | Admitting: Physician Assistant

## 2020-02-23 DIAGNOSIS — R200 Anesthesia of skin: Secondary | ICD-10-CM

## 2020-02-23 DIAGNOSIS — R202 Paresthesia of skin: Secondary | ICD-10-CM

## 2020-02-23 DIAGNOSIS — M25511 Pain in right shoulder: Secondary | ICD-10-CM

## 2020-03-04 ENCOUNTER — Ambulatory Visit: Admission: RE | Admit: 2020-03-04 | Payer: Worker's Compensation | Source: Ambulatory Visit

## 2020-03-08 ENCOUNTER — Ambulatory Visit: Payer: Self-pay | Attending: Internal Medicine

## 2020-03-08 DIAGNOSIS — Z23 Encounter for immunization: Secondary | ICD-10-CM

## 2020-03-08 NOTE — Progress Notes (Signed)
Covid-19 Vaccination Clinic  Name:  Cindy Rodriguez    MRN: [id number] DOB: Jun 06, 1996  03/08/2020  [name] was observed post Covid-19 immunization for 15 minutes without incident. She was provided with Vaccine Information Sheet and instruction to access the V-Safe system.   [name] was instructed to call 911 with any severe reactions post vaccine: Marland Kitchen Difficulty breathing  . Swelling of face and throat  . A fast heartbeat  . A bad rash all over body  . Dizziness and weakness   Immunizations Administered    Name Date Dose VIS Date Route   Pfizer COVID-19 Vaccine 03/08/2020  3:44 PM 0.3 mL 09/17/2018 Intramuscular   Manufacturer: [employer], [employer]   Lot: [id number]   NDC: [address]-6

## 2020-04-05 ENCOUNTER — Ambulatory Visit: Payer: Self-pay

## 2020-12-07 ENCOUNTER — Emergency Department
Admission: EM | Admit: 2020-12-07 | Discharge: 2020-12-07 | Disposition: A | Discharge status: Home / Self Care | Payer: Medicaid Other | Attending: Emergency Medicine | Admitting: Emergency Medicine

## 2020-12-07 ENCOUNTER — Other Ambulatory Visit: Payer: Self-pay

## 2020-12-07 DIAGNOSIS — R1010 Upper abdominal pain, unspecified: Secondary | ICD-10-CM | POA: Insufficient documentation

## 2020-12-07 DIAGNOSIS — Z87891 Personal history of nicotine dependence: Secondary | ICD-10-CM | POA: Insufficient documentation

## 2020-12-07 DIAGNOSIS — R197 Diarrhea, unspecified: Secondary | ICD-10-CM | POA: Diagnosis not present

## 2020-12-07 DIAGNOSIS — Z8616 Personal history of COVID-19: Secondary | ICD-10-CM | POA: Diagnosis not present

## 2020-12-07 DIAGNOSIS — R42 Dizziness and giddiness: Secondary | ICD-10-CM | POA: Diagnosis not present

## 2020-12-07 LAB — BASIC METABOLIC PANEL
Anion gap: 6 (ref 5–15)
BUN: 15 mg/dL (ref 6–20)
CO2: 26 mmol/L (ref 22–32)
Calcium: 9.1 mg/dL (ref 8.9–10.3)
Chloride: 106 mmol/L (ref 98–111)
Creatinine, Ser: 0.77 mg/dL (ref 0.44–1.00)
GFR, Estimated: >60 mL/min (ref >60)
Glucose, Bld: 102 mg/dL — ABNORMAL HIGH (ref 70–99)
Potassium: 4 mmol/L (ref 3.5–5.1)
Sodium: 138 mmol/L (ref 135–145)

## 2020-12-07 LAB — URINALYSIS, COMPLETE (UACMP) WITH MICROSCOPIC
Bacteria, UA: NONE SEEN
Bilirubin Urine: NEGATIVE
Glucose, UA: NEGATIVE mg/dL
Hgb urine dipstick: NEGATIVE
Ketones, ur: NEGATIVE mg/dL
Leukocytes,Ua: NEGATIVE
Nitrite: NEGATIVE
Protein, ur: NEGATIVE mg/dL
Specific Gravity, Urine: 1.026 (ref 1.005–1.030)
pH: 6 (ref 5.0–8.0)

## 2020-12-07 LAB — CBC WITH DIFFERENTIAL/PLATELET
Abs Immature Granulocytes: 0.01 10*3/uL (ref 0.00–0.07)
Basophils Absolute: 0 10*3/uL (ref 0.0–0.1)
Basophils Relative: 1 %
Eosinophils Absolute: 0.3 10*3/uL (ref 0.0–0.5)
Eosinophils Relative: 6 %
HCT: 38 % (ref 36.0–46.0)
Hemoglobin: 12.6 g/dL (ref 12.0–15.0)
Immature Granulocytes: 0 %
Lymphocytes Relative: 32 %
Lymphs Abs: 1.9 10*3/uL (ref 0.7–4.0)
MCH: 27.7 pg (ref 26.0–34.0)
MCHC: 33.2 g/dL (ref 30.0–36.0)
MCV: 83.5 fL (ref 80.0–100.0)
Monocytes Absolute: 0.6 10*3/uL (ref 0.1–1.0)
Monocytes Relative: 9 %
Neutro Abs: 3.2 10*3/uL (ref 1.7–7.7)
Neutrophils Relative %: 52 %
Platelets: 227 10*3/uL (ref 150–400)
RBC: 4.55 MIL/uL (ref 3.87–5.11)
RDW: 14.7 % (ref 11.5–15.5)
WBC: 6.1 10*3/uL (ref 4.0–10.5)
nRBC: 0 % (ref 0.0–0.2)

## 2020-12-07 LAB — POC URINE PREG, ED: Preg Test, Ur: NEGATIVE

## 2020-12-07 NOTE — ED Provider Notes (Signed)
-----------------------------------------   9:26 AM on 12/07/2020 -----------------------------------------  I took over care of this patient from Dr. Larinda Buttery.  Lab work-up is unremarkable.  Urinalysis and urine pregnancy are negative.  The patient appears comfortable on reassessment and has had no recurrent abdominal pain or dizziness.  She is stable for discharge home.  Return precautions given, and she expresses understanding.   Dionne Bucy, MD 12/07/20 [phone]

## 2020-12-07 NOTE — ED Triage Notes (Signed)
Reports diarrhea X 1 this AM followed by "burning" stomach pain to lower immediately after. Also reports "breaking out in sweat" when using bathroom. Denies abdominal pain now.

## 2020-12-07 NOTE — ED Provider Notes (Signed)
[facility] Emergency Department Provider Note   ____________________________________________   Event Date/Time   First MD Initiated Contact with Patient 12/07/20 [phone]     (approximate)  I have reviewed the triage vital signs and the nursing notes.   HISTORY  Chief Complaint Diarrhea    HPI Cindy Rodriguez is a 25 y.o. female with no significant past medical history presents to the ED complaining of diarrhea.  Patient reports that she was at work this morning when she suddenly started to feel lightheaded followed by a loose bowel movement.  This was associated with burning discomfort in her upper abdomen that is now improving.  She denies any associated chest pain or shortness of breath.  She has never had similar symptoms in the past and denies any history of GERD.  She states that she "broke out in a sweat" when the episode occurred.  She is otherwise felt well with no fevers, flank pain, dysuria, hematuria, vaginal bleeding, or discharge.  Her LMP was approximately 3 weeks ago.        Past Medical History:  Diagnosis Date  . Dyspnea 03/27/2019   + Covid-19 (03/23/2019)    Patient Active Problem List   Diagnosis Date Noted  . IUGR (intrauterine growth restriction) affecting care of mother, third trimester, not applicable or unspecified fetus 11/01/2019  . Delivery of first pregnancy by cesarean section using transverse incision of lower segment of uterus   . IUGR (intrauterine growth restriction) affecting care of mother, third trimester, fetus 1 10/30/2019  . Pregnancy affected by fetal growth restriction 10/20/2019  . Noncompliant pregnant patient no prenatal care x 16 wks 08/27/2019  . Encounter for procreative genetic counseling 06/10/2019  . Positive result on maternal serum screen for neural tube defects 06/10/2019  . Abnormal chromosomal and genetic finding on antenatal screening mother 06/09/2019  . Trichomonal vaginitis in pregnancy in  second trimester 05/08/2019  . Supervision of other high risk pregnancy, antepartum 05/05/2019  . History of COVID-19 05/05/2019  . Obesity affecting pregnancy, antepartum BMI=44.2 05/05/2019  . Tobacco use in pregnancy, antepartum 05/05/2019  . 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 Morbid obesity (HCC) 10/18/2017    Past Surgical History:  Procedure Laterality Date  . CESAREAN SECTION N/A 11/01/2019   Procedure: CESAREAN SECTION;  Surgeon: Natale Milch, MD;  Location: [facility] ORS;  Service: Obstetrics;  Laterality: N/A;    Prior to Admission medications   Not on File    Allergies Mango butter  Family History  Problem Relation Age of Onset  . Hypertension Mother   . Diabetes Mother   . Asthma Mother   . Asthma Sister   . Thyroid disease Maternal Grandmother   . Seizures Maternal Grandmother   . Cancer Maternal Grandmother   . Asthma Maternal Grandmother   . Drug abuse Maternal Grandfather   . Cancer Maternal Grandfather   . Sickle cell trait Maternal Grandfather   . Hypertension Paternal Grandmother   . Cirrhosis Paternal Grandmother   . Hypertension Paternal Grandfather   . Drug abuse Paternal Grandfather   . Heart disease Paternal Grandfather   . Cirrhosis Paternal Grandfather     Social History Social History   Tobacco Use  . Smoking status: Former Smoker    Packs/day: 0.50    Types: Cigarettes  . Smokeless tobacco: Never Used  Vaping Use  . Vaping Use: Never used  Substance Use Topics  . Alcohol use: Not Currently  . Drug use: Not  Currently    Review of Systems  Constitutional: No fever/chills Eyes: No visual changes. ENT: No sore throat. Cardiovascular: Denies chest pain.  Positive for lightheadedness. Respiratory: Denies shortness of breath. Gastrointestinal: Positive for abdominal pain.  No nausea, no vomiting.  Positive for diarrhea.  No constipation. Genitourinary: Negative for dysuria. Musculoskeletal: Negative for back  pain. Skin: Negative for rash. Neurological: Negative for headaches, focal weakness or numbness.  ____________________________________________   PHYSICAL EXAM:  VITAL SIGNS: ED Triage Vitals [12/07/20 0620]  Enc Vitals Group     BP 114/75     Pulse Rate 66     Resp 14     Temp 98.6 F (37 C)     Temp Source Oral     SpO2 100 %     Weight 200 lb (90.7 kg)     Height 5\' 1"  (1.549 m)     Head Circumference      Peak Flow      Pain Score 0     Pain Loc      Pain Edu?      Excl. in GC?     Constitutional: Alert and oriented. Eyes: Conjunctivae are normal. Head: Atraumatic. Nose: No congestion/rhinnorhea. Mouth/Throat: Mucous membranes are moist. Neck: Normal ROM Cardiovascular: Normal rate, regular rhythm. Grossly normal heart sounds.  2+ radial pulses bilaterally. Respiratory: Normal respiratory effort.  No retractions. Lungs CTAB. Gastrointestinal: Soft and nontender. No distention. Genitourinary: deferred Musculoskeletal: No lower extremity tenderness nor edema. Neurologic:  Normal speech and language. No gross focal neurologic deficits are appreciated. Skin:  Skin is warm, dry and intact. No rash noted. Psychiatric: Mood and affect are normal. Speech and behavior are normal.  ____________________________________________   LABS (all labs ordered are listed, but only abnormal results are displayed)  Labs Reviewed  CBC WITH DIFFERENTIAL/PLATELET  URINALYSIS, COMPLETE (UACMP) WITH MICROSCOPIC  BASIC METABOLIC PANEL  POC URINE PREG, ED   ____________________________________________  EKG  ED ECG REPORT I, , the attending physician, personally viewed and interpreted this ECG.   Date: 12/07/2020  EKG Time: 6:35  Rate: 70  Rhythm: normal sinus rhythm  Axis: Normal  Intervals:none  ST&T Change: None   PROCEDURES  Procedure(s) performed (including Critical Care):  Procedures   ____________________________________________   INITIAL  IMPRESSION / ASSESSMENT AND PLAN / ED COURSE       25 year old female with no significant past medical history presents the ED following episode of lightheadedness, diarrhea, and burning discomfort in her upper abdomen while at work this morning.  Symptoms have resolved and she has no abdominal tenderness on exam.  EKG shows no evidence of arrhythmia or ischemia and I have low suspicion for cardiac etiology.  We will briefly observe patient on cardiac monitor and screening labs along with pregnancy testing and UA.  If work-up is unremarkable, patient be appropriate for discharge home with PCP follow-up.      ____________________________________________   FINAL CLINICAL IMPRESSION(S) / ED DIAGNOSES  Final diagnoses:  Lightheadedness  Diarrhea, unspecified type     ED Discharge Orders    None       Note:  This document was prepared using Dragon voice recognition software and may include unintentional dictation errors.   21, MD 12/07/20 [phone]

## 2020-12-07 NOTE — ED Notes (Signed)
Pt has been provided with discharge instructions. Pt denies any questions or concerns at this time. Pt verbalizes understanding for follow up care and d/c.  VSS.  Pt left department with all belongings.  

## 2020-12-24 ENCOUNTER — Ambulatory Visit: Payer: Self-pay | Admitting: Obstetrics and Gynecology

## 2021-04-01 ENCOUNTER — Other Ambulatory Visit: Payer: Self-pay

## 2021-04-01 ENCOUNTER — Encounter: Payer: Self-pay | Admitting: Emergency Medicine

## 2021-04-01 ENCOUNTER — Emergency Department
Admission: EM | Admit: 2021-04-01 | Discharge: 2021-04-01 | Disposition: A | Discharge status: Home / Self Care | Payer: Medicaid Other | Attending: Emergency Medicine | Admitting: Emergency Medicine

## 2021-04-01 DIAGNOSIS — Z87891 Personal history of nicotine dependence: Secondary | ICD-10-CM | POA: Insufficient documentation

## 2021-04-01 DIAGNOSIS — X102XXA Contact with fats and cooking oils, initial encounter: Secondary | ICD-10-CM | POA: Diagnosis not present

## 2021-04-01 DIAGNOSIS — T2000XA Burn of unspecified degree of head, face, and neck, unspecified site, initial encounter: Secondary | ICD-10-CM | POA: Diagnosis present

## 2021-04-01 DIAGNOSIS — Z8616 Personal history of COVID-19: Secondary | ICD-10-CM | POA: Insufficient documentation

## 2021-04-01 DIAGNOSIS — T2010XA Burn of first degree of head, face, and neck, unspecified site, initial encounter: Secondary | ICD-10-CM | POA: Diagnosis not present

## 2021-04-01 MED ORDER — BACITRACIN-NEOMYCIN-POLYMYXIN 400-5-5000 EX OINT
TOPICAL_OINTMENT | Freq: Once | CUTANEOUS | Status: AC
  Filled 2021-04-01: qty 1

## 2021-04-01 MED ORDER — FLUORESCEIN SODIUM 1 MG OP STRP
1.0000 | ORAL_STRIP | Freq: Once | OPHTHALMIC | Status: DC
  Filled 2021-04-01: qty 1
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
## 2021-05-16 ENCOUNTER — Emergency Department
Admission: EM | Admit: 2021-05-16 | Discharge: 2021-05-16 | Disposition: A | Discharge status: Home / Self Care | Payer: Medicaid Other

## 2021-05-25 ENCOUNTER — Other Ambulatory Visit: Payer: Self-pay

## 2021-05-25 ENCOUNTER — Ambulatory Visit (LOCAL_COMMUNITY_HEALTH_CENTER): Payer: Medicaid Other

## 2021-05-25 VITALS — BP 95/65 | Ht 61.0 in | Wt 233.0 lb

## 2021-05-25 DIAGNOSIS — Z3201 Encounter for pregnancy test, result positive: Secondary | ICD-10-CM | POA: Diagnosis not present

## 2021-05-25 LAB — PREGNANCY, URINE: Preg Test, Ur: POSITIVE — AB

## 2021-05-25 MED ORDER — PRENATAL VITAMIN 27-0.8 MG PO TABS: 1.0000 | ORAL_TABLET | ORAL | 0 refills | Status: AC

## 2021-05-25 NOTE — Progress Notes (Signed)
Patient desires her prenatal care at [facility] and will call tomorrow for her appointment. Hart Carwin, RN

## 2021-06-14 ENCOUNTER — Emergency Department: Payer: Medicaid Other

## 2021-06-14 ENCOUNTER — Other Ambulatory Visit: Payer: Self-pay

## 2021-06-14 ENCOUNTER — Emergency Department
Admission: EM | Admit: 2021-06-14 | Discharge: 2021-06-14 | Disposition: A | Discharge status: Home / Self Care | Payer: Medicaid Other | Attending: Emergency Medicine | Admitting: Emergency Medicine

## 2021-06-14 DIAGNOSIS — N9489 Other specified conditions associated with female genital organs and menstrual cycle: Secondary | ICD-10-CM | POA: Diagnosis not present

## 2021-06-14 DIAGNOSIS — R110 Nausea: Secondary | ICD-10-CM | POA: Diagnosis not present

## 2021-06-14 DIAGNOSIS — B9689 Other specified bacterial agents as the cause of diseases classified elsewhere: Secondary | ICD-10-CM

## 2021-06-14 DIAGNOSIS — R1031 Right lower quadrant pain: Secondary | ICD-10-CM | POA: Insufficient documentation

## 2021-06-14 DIAGNOSIS — Z8616 Personal history of COVID-19: Secondary | ICD-10-CM | POA: Diagnosis not present

## 2021-06-14 DIAGNOSIS — Z87891 Personal history of nicotine dependence: Secondary | ICD-10-CM | POA: Insufficient documentation

## 2021-06-14 DIAGNOSIS — Z20822 Contact with and (suspected) exposure to covid-19: Secondary | ICD-10-CM | POA: Insufficient documentation

## 2021-06-14 DIAGNOSIS — R109 Unspecified abdominal pain: Secondary | ICD-10-CM

## 2021-06-14 DIAGNOSIS — Z3A10 10 weeks gestation of pregnancy: Secondary | ICD-10-CM | POA: Insufficient documentation

## 2021-06-14 DIAGNOSIS — O219 Vomiting of pregnancy, unspecified: Secondary | ICD-10-CM | POA: Insufficient documentation

## 2021-06-14 DIAGNOSIS — O26891 Other specified pregnancy related conditions, first trimester: Secondary | ICD-10-CM | POA: Diagnosis not present

## 2021-06-14 DIAGNOSIS — N390 Urinary tract infection, site not specified: Secondary | ICD-10-CM

## 2021-06-14 DIAGNOSIS — N760 Acute vaginitis: Secondary | ICD-10-CM

## 2021-06-14 DIAGNOSIS — O26899 Other specified pregnancy related conditions, unspecified trimester: Secondary | ICD-10-CM

## 2021-06-14 LAB — URINALYSIS, ROUTINE W REFLEX MICROSCOPIC
Bilirubin Urine: NEGATIVE
Glucose, UA: NEGATIVE mg/dL
Hgb urine dipstick: NEGATIVE
Ketones, ur: 80 mg/dL — AB
Nitrite: NEGATIVE
Protein, ur: 30 mg/dL — AB
Specific Gravity, Urine: 1.02 (ref 1.005–1.030)
pH: 6 (ref 5.0–8.0)

## 2021-06-14 LAB — COMPREHENSIVE METABOLIC PANEL
ALT: 14 U/L (ref 0–44)
AST: 17 U/L (ref 15–41)
Albumin: 4.3 g/dL (ref 3.5–5.0)
Alkaline Phosphatase: 36 U/L — ABNORMAL LOW (ref 38–126)
Anion gap: 8 (ref 5–15)
BUN: 6 mg/dL (ref 6–20)
CO2: 22 mmol/L (ref 22–32)
Calcium: 9.4 mg/dL (ref 8.9–10.3)
Chloride: 99 mmol/L (ref 98–111)
Creatinine, Ser: 0.68 mg/dL (ref 0.44–1.00)
GFR, Estimated: >60 mL/min (ref >60)
Glucose, Bld: 105 mg/dL — ABNORMAL HIGH (ref 70–99)
Potassium: 3.2 mmol/L — ABNORMAL LOW (ref 3.5–5.1)
Sodium: 129 mmol/L — ABNORMAL LOW (ref 135–145)
Total Bilirubin: 0.8 mg/dL (ref 0.3–1.2)
Total Protein: 8.3 g/dL — ABNORMAL HIGH (ref 6.5–8.1)

## 2021-06-14 LAB — CBC
HCT: 38.4 % (ref 36.0–46.0)
Hemoglobin: 13.4 g/dL (ref 12.0–15.0)
MCH: 28 pg (ref 26.0–34.0)
MCHC: 34.9 g/dL (ref 30.0–36.0)
MCV: 80.3 fL (ref 80.0–100.0)
Platelets: 263 10*3/uL (ref 150–400)
RBC: 4.78 MIL/uL (ref 3.87–5.11)
RDW: 13.8 % (ref 11.5–15.5)
WBC: 11.8 10*3/uL — ABNORMAL HIGH (ref 4.0–10.5)
nRBC: 0 % (ref 0.0–0.2)

## 2021-06-14 LAB — RESP PANEL BY RT-PCR (FLU A&B, COVID) ARPGX2
Influenza A by PCR: NEGATIVE
Influenza B by PCR: NEGATIVE
SARS Coronavirus 2 by RT PCR: NEGATIVE

## 2021-06-14 LAB — WET PREP, GENITAL
Sperm: NONE SEEN
Trich, Wet Prep: NONE SEEN
WBC, Wet Prep HPF POC: <10 — AB (ref <10)
Yeast Wet Prep HPF POC: NONE SEEN

## 2021-06-14 LAB — CHLAMYDIA/NGC RT PCR (ARMC ONLY)
Chlamydia Tr: NOT DETECTED
N gonorrhoeae: NOT DETECTED

## 2021-06-14 LAB — LIPASE, BLOOD: Lipase: 27 U/L (ref 11–51)

## 2021-06-14 LAB — HCG, QUANTITATIVE, PREGNANCY: hCG, Beta Chain, Quant, S: 138298 m[IU]/mL — ABNORMAL HIGH (ref <5)

## 2021-06-14 MED ORDER — METOCLOPRAMIDE HCL 5 MG/ML IJ SOLN
10.0000 mg | Freq: Once | INTRAMUSCULAR | Status: AC
  Administered 2021-06-14: 10 mg via INTRAVENOUS
  Filled 2021-06-14: qty 2

## 2021-06-14 MED ORDER — FOSFOMYCIN TROMETHAMINE 3 G PO PACK
3.0000 g | PACK | Freq: Once | ORAL | Status: AC
  Administered 2021-06-14: 3 g via ORAL
  Filled 2021-06-14: qty 3

## 2021-06-14 MED ORDER — METRONIDAZOLE 500 MG PO TABS: 500.0000 mg | ORAL_TABLET | Freq: Two times a day (BID) | ORAL | 0 refills | Status: AC

## 2021-06-14 MED ORDER — ACETAMINOPHEN 500 MG PO TABS
1000.0000 mg | ORAL_TABLET | Freq: Once | ORAL | Status: AC
  Administered 2021-06-14: 1000 mg via ORAL
  Filled 2021-06-14: qty 2

## 2021-06-14 MED ORDER — SODIUM CHLORIDE 0.9 % IV BOLUS
1000.0000 mL | Freq: Once | INTRAVENOUS | Status: AC
  Administered 2021-06-14: 1000 mL via INTRAVENOUS

## 2021-06-14 MED ORDER — FENTANYL CITRATE PF 50 MCG/ML IJ SOSY
50.0000 ug | PREFILLED_SYRINGE | Freq: Once | INTRAMUSCULAR | Status: AC
  Administered 2021-06-14: 50 ug via INTRAVENOUS
  Filled 2021-06-14: qty 1

## 2021-06-14 MED ORDER — DICYCLOMINE HCL 10 MG PO CAPS
10.0000 mg | ORAL_CAPSULE | Freq: Once | ORAL | Status: AC
  Administered 2021-06-14: 10 mg via ORAL
  Filled 2021-06-14: qty 1

## 2021-06-14 NOTE — ED Notes (Signed)
Pt in bed, pt reports a slight decrease in pain, md notified of fever, tylenol given,

## 2021-06-14 NOTE — ED Notes (Signed)
Pt back from ultrasound, med given

## 2021-06-14 NOTE — Discharge Instructions (Signed)
Please seek medical attention for any high fevers, chest pain, shortness of breath, change in behavior, persistent vomiting, bloody stool or any other new or concerning symptoms.  

## 2021-06-14 NOTE — ED Triage Notes (Signed)
Pt c/o RLQ pain that radiates into the back with N/V for the past 24 hrs, states she is 8-[redacted] weeks pregnant, no bleeding at this time

## 2021-06-14 NOTE — ED Provider Notes (Signed)
MRI did not show any acute inflammatory process in either the abdomen or pelvis.  Wet prep did come back positive for clue cells.  I discussed this with the patient.  At this time have low suspicion for PID.  Will give patient 1 dose of fosfomycin here in the emergency department given UA findings.  Additionally will prescribe Flagyl for BV.  Discussed importance of close OB/GYN follow-up.   Phineas Semen, MD 06/14/21 [phone]

## 2021-06-14 NOTE — ED Notes (Signed)
Pt back from mri, pt requests food, food and ginger ale given, swabs sent, pt reports that her pain is coming back, requests pain med.

## 2021-06-14 NOTE — ED Provider Notes (Signed)
[facility] Emergency Department Provider Note  ____________________________________________   Event Date/Time   First MD Initiated Contact with Patient 06/14/21 1047     (approximate)  I have reviewed the triage vital signs and the nursing notes.   HISTORY  Chief Complaint Abdominal Pain    HPI Cindy Rodriguez is a 25 y.o. female who is 8 to [redacted] weeks pregnant who comes in with right lower quadrant pain.  Patient reports this is her second pregnancy.  She states that her first pregnancy was born at 22 weeks.  She denies having an ultrasound yet this pregnancy but plans to follow at [facility].  She reports having severe pain.  She is not really able to localize the pain to states that it really hurts all over but most in the right lower quadrant, constant, starting recently.  She reports still having her gallbladder.  Reports some associated nausea, vomiting with it.  Denies any vaginal bleeding.  She reports being with 1 partner and denies vaginal discharge          Past Medical History:  Diagnosis Date   Dyspnea 03/27/2019   + Covid-19 (03/23/2019)    Patient Active Problem List   Diagnosis Date Noted   IUGR (intrauterine growth restriction) affecting care of mother, third trimester, not applicable or unspecified fetus 11/01/2019   Delivery of first pregnancy by cesarean section using transverse incision of lower segment of uterus    IUGR (intrauterine growth restriction) affecting care of mother, third trimester, fetus 1 10/30/2019   Pregnancy affected by fetal growth restriction 10/20/2019   Noncompliant pregnant patient no prenatal care x 16 wks 08/27/2019   Encounter for procreative genetic counseling 06/10/2019   Positive result on maternal serum screen for neural tube defects 06/10/2019   Abnormal chromosomal and genetic finding on antenatal screening mother 06/09/2019   Trichomonal vaginitis in pregnancy in second trimester 05/08/2019    Supervision of other high risk pregnancy, antepartum 05/05/2019   History of COVID-19 05/05/2019   Obesity affecting pregnancy, antepartum BMI=44.2 05/05/2019   Tobacco use in pregnancy, antepartum 05/05/2019   Marijuana use; +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 MJ 05/05/2019   Morbid obesity (HCC) 10/18/2017    Past Surgical History:  Procedure Laterality Date   CESAREAN SECTION N/A 11/01/2019   Procedure: CESAREAN SECTION;  Surgeon: Natale Milch, MD;  Location: [facility] ORS;  Service: Obstetrics;  Laterality: N/A;    Prior to Admission medications   Not on File    Allergies Mango butter  Family History  Problem Relation Age of Onset   Hypertension Mother    Diabetes Mother    Asthma Mother    Asthma Sister    Thyroid disease Maternal Grandmother    Seizures Maternal Grandmother    Cancer Maternal Grandmother    Asthma Maternal Grandmother    Drug abuse Maternal Grandfather    Cancer Maternal Grandfather    Sickle cell trait Maternal Grandfather    Hypertension Paternal Grandmother    Cirrhosis Paternal Grandmother    Hypertension Paternal Grandfather    Drug abuse Paternal Grandfather    Heart disease Paternal Grandfather    Cirrhosis Paternal Grandfather     Social History Social History   Tobacco Use   Smoking status: Former    Packs/day: 0.50    Years: 1.00    Pack years: 0.50    Types: Cigarettes    Quit date: 2021    Years since quitting: 1.8   Smokeless tobacco: Never  Tobacco comments:    She may smoke a cigarette once ina while, but rare  Vaping Use   Vaping Use: Never used  Substance Use Topics   Alcohol use: Not Currently    Alcohol/week: 2.0 standard drinks    Types: 1 Glasses of wine, 1 Shots of liquor per week    Comment: Last ETOH 1 month ago   Drug use: Not Currently      Review of Systems Constitutional: No fever/chills Eyes: No visual changes. ENT: No sore throat. Cardiovascular: Denies chest pain. Respiratory: Denies shortness of  breath. Gastrointestinal: Abdominal pain, nausea, vomiting no diarrhea.  No constipation. Genitourinary: Negative for dysuria. Musculoskeletal: Negative for back pain. Skin: Negative for rash. Neurological: Negative for headaches, focal weakness or numbness. All other ROS negative ____________________________________________   PHYSICAL EXAM:  VITAL SIGNS: ED Triage Vitals  Enc Vitals Group     BP 06/14/21 1005 105/66     Pulse Rate 06/14/21 1005 95     Resp 06/14/21 1005 20     Temp 06/14/21 1005 98.4 F (36.9 C)     Temp Source 06/14/21 1005 Oral     SpO2 06/14/21 1005 98 %     Weight 06/14/21 1001 230 lb (104.3 kg)     Height 06/14/21 1001 5\' 1"  (1.549 m)     Head Circumference --      Peak Flow --      Pain Score 06/14/21 1001 10     Pain Loc --      Pain Edu? --      Excl. in GC? --     Constitutional: Alert and oriented.  Appears very uncomfortable Eyes: Conjunctivae are normal. EOMI. Head: Atraumatic. Nose: No congestion/rhinnorhea. Mouth/Throat: Mucous membranes are moist.   Neck: No stridor. Trachea Midline. FROM Cardiovascular: Normal rate, regular rhythm. Grossly normal heart sounds.  Good peripheral circulation. Respiratory: Normal respiratory effort.  No retractions. Lungs CTAB. Gastrointestinal: Soft but seems tender on the right side.  No distention. No abdominal bruits.  Musculoskeletal: No lower extremity tenderness nor edema.  No joint effusions. Neurologic:  Normal speech and language. No gross focal neurologic deficits are appreciated.  Skin:  Skin is warm, dry and intact. No rash noted. Psychiatric: Mood and affect are normal. Speech and behavior are normal. GU: Deferred   ____________________________________________   LABS (all labs ordered are listed, but only abnormal results are displayed)  Labs Reviewed  CBC - Abnormal; Notable for the following components:      Result Value   WBC 11.8 (*)    All other components within normal limits   LIPASE, BLOOD  COMPREHENSIVE METABOLIC PANEL  URINALYSIS, ROUTINE W REFLEX MICROSCOPIC  HCG, QUANTITATIVE, PREGNANCY   ____________________________________________  RADIOLOGY   Official radiology report(s): 06/16/21 OB LESS THAN 14 WEEKS W/ OB TRANSVAGINAL AND DOPPLER  Result Date: 06/14/2021 CLINICAL DATA:  Abdominal pain EXAM: OBSTETRIC <14 WK 06/16/2021 AND TRANSVAGINAL OB US DOPPLER ULTRASOUND OF OVARIES TECHNIQUE: Both transabdominal and transvaginal ultrasound examinations were performed for complete evaluation of the gestation as well as the maternal uterus, adnexal regions, and pelvic cul-de-sac. Transvaginal technique was performed to assess early pregnancy. Color and duplex Doppler ultrasound was utilized to evaluate blood flow to the ovaries. COMPARISON:  None. FINDINGS: Study is limited due to patient condition and positioning. Intrauterine gestational sac: Single Yolk sac:  Not Visualized. Embryo:  Visualized. Cardiac Activity: Visualized. Heart Rate: 176 bpm CRL:   33 mm   10 w 2 d  [place] EDC: 01/08/2022 Subchorionic hemorrhage: Small measuring approximally 1.4 x 2.4 x 2 cm. Maternal uterus/adnexae: There appear to be small uterine fibroids measuring up to 1.1 cm in size. Pulsed Doppler evaluation of both ovaries demonstrates normal appearing low-resistance arterial and venous waveforms. IMPRESSION: 1. Single live intrauterine pregnancy with estimated gestational age of [redacted] weeks 2 days. EDD based on the current study is 01/08/2022. 2. Small subchorionic hemorrhage. 3. Small uterine fibroids. Electronically Signed   By: Jannifer Hick M.D.   On: 06/14/2021 13:26    ____________________________________________   PROCEDURES  Procedure(s) performed (including Critical Care):  Procedures   ____________________________________________   INITIAL IMPRESSION / ASSESSMENT AND PLAN / ED COURSE  Cindy Rodriguez was evaluated in Emergency Department on 06/14/2021 for the symptoms  described in the history of present illness. She was evaluated in the context of the global COVID-19 pandemic, which necessitated consideration that the patient might be at risk for infection with the SARS-CoV-2 virus that causes COVID-19. Institutional protocols and algorithms that pertain to the evaluation of patients at risk for COVID-19 are in a state of rapid change based on information released by regulatory bodies including the CDC and federal and state organizations. These policies and algorithms were followed during the patient's care in the ED.    Patient comes in with pregnancy with severe abdominal pain.  This is concerning for the possibility of ectopic.  Will get ultrasound to further evaluate.  How uncomfortable patient is I did call ultrasound to see if she could be moved to the top.  Likely her vitals are stable at this time.  We will also get urine to evaluate for any UTI.  If ultrasound is reassuring consider appendicitis and may need further imaging with MRI.  Will give 1 dose of IV fentanyl and IV Reglan to help with symptoms.  We discussed the risk of fentanyl in pregnancy.  Patient's white count is slightly elevated but this could just be reactive.  Her hemoglobin is stable.  Her sodium is low at 129 but patient giving some IV fluids her urine does have some ketones in it but again she is already getting fluids.  There is a lot of squamous cells in it but she did not bring any symptoms of dysuria.  Patient handed off to oncoming team pending MRI         ____________________________________________   FINAL CLINICAL IMPRESSION(S) / ED DIAGNOSES   Final diagnoses:  Abdominal pain affecting pregnancy      MEDICATIONS GIVEN DURING THIS VISIT:  Medications  fentaNYL (SUBLIMAZE) injection 50 mcg (50 mcg Intravenous Given 06/14/21 1230)  metoCLOPramide (REGLAN) injection 10 mg (10 mg Intravenous Given 06/14/21 1227)  sodium chloride 0.9 % bolus 1,000 mL (0 mLs  Intravenous Stopped 06/14/21 1414)  acetaminophen (TYLENOL) tablet 1,000 mg (1,000 mg Oral Given 06/14/21 1254)     ED Discharge Orders     None        Note:  This document was prepared using Dragon voice recognition software and may include unintentional dictation errors.    Concha Se, MD 06/14/21 [id number]

## 2021-06-14 NOTE — ED Notes (Signed)
Pt in ultrasound

## 2021-06-14 NOTE — ED Notes (Signed)
Pt in MRI.

## 2021-06-17 LAB — URINE CULTURE: Culture: 80000 — AB

## 2021-06-24 ENCOUNTER — Encounter: Payer: Medicaid Other | Admitting: Licensed Practical Nurse

## 2021-07-06 ENCOUNTER — Other Ambulatory Visit (HOSPITAL_COMMUNITY)
Admission: RE | Admit: 2021-07-06 | Discharge: 2021-07-06 | Disposition: A | Discharge status: Home / Self Care | Payer: Medicaid Other | Source: Ambulatory Visit | Attending: Licensed Practical Nurse | Admitting: Licensed Practical Nurse

## 2021-07-06 ENCOUNTER — Ambulatory Visit (INDEPENDENT_AMBULATORY_CARE_PROVIDER_SITE_OTHER): Payer: Medicaid Other | Admitting: Licensed Practical Nurse

## 2021-07-06 ENCOUNTER — Encounter: Payer: Self-pay | Admitting: Licensed Practical Nurse

## 2021-07-06 ENCOUNTER — Other Ambulatory Visit: Payer: Self-pay

## 2021-07-06 VITALS — BP 126/84 | Wt 225.0 lb

## 2021-07-06 DIAGNOSIS — Z113 Encounter for screening for infections with a predominantly sexual mode of transmission: Secondary | ICD-10-CM

## 2021-07-06 DIAGNOSIS — Z131 Encounter for screening for diabetes mellitus: Secondary | ICD-10-CM

## 2021-07-06 DIAGNOSIS — Z124 Encounter for screening for malignant neoplasm of cervix: Secondary | ICD-10-CM

## 2021-07-06 DIAGNOSIS — O0990 Supervision of high risk pregnancy, unspecified, unspecified trimester: Secondary | ICD-10-CM

## 2021-07-06 DIAGNOSIS — Z3A13 13 weeks gestation of pregnancy: Secondary | ICD-10-CM

## 2021-07-06 NOTE — Progress Notes (Signed)
NOB today. Went to ER a few weeks ago for pelvic pain.

## 2021-07-06 NOTE — Progress Notes (Signed)
Codes: O99.210   Obesity in pregnancy (BMI 30.0-39.9 kg/m2)  O99.210, E66.01 Maternal Morbid Obesity (BMI ?40 kg/m2 ).**Have to use both codes, this is a [place] code and risk adjusts/more reimbursement**  Obesity is  defined as body mass index (BMI) ?30 kg/m2 .  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     Morbid obesity ([place]) 10/18/2017    Past Surgical History:  Past Surgical History:  Procedure Laterality Date   CESAREAN SECTION N/A 11/01/2019   Procedure: CESAREAN SECTION;  Surgeon: Homero Fellers, MD;  Location: [facility] ORS;  Service: Obstetrics;  Laterality: N/A;    Gynecologic History: Patient's last menstrual period was 04/04/2021 (exact date).  Obstetric History: G3P1011  Family History:  Family History  Problem Relation Age of Onset   Hypertension Mother    Diabetes Mother    Asthma Mother    Asthma Sister    Thyroid disease Maternal Grandmother    Seizures Maternal Grandmother    Cancer Maternal Grandmother    Asthma Maternal Grandmother    Drug abuse Maternal Grandfather    Cancer Maternal Grandfather    Sickle cell trait Maternal Grandfather    Hypertension Paternal Grandmother    Cirrhosis Paternal Grandmother    Hypertension Paternal Grandfather    Drug abuse Paternal Grandfather    Heart disease Paternal Grandfather    Cirrhosis Paternal Grandfather     Social History:  Social History   Socioeconomic History   Marital status: Single    Spouse name: Not on file   Number of children: Not on file   Years of education: Not on file   Highest education level: Not on file  Occupational History   Not on file  Tobacco Use   Smoking status: Every Day    Packs/day: 0.50    Years: 1.00    Pack years: 0.50    Types: Cigarettes    Last attempt to quit: 2021    Years since quitting: 1.9   Smokeless tobacco: Never   Tobacco comments:    She may smoke a cigarette  once ina while, but rare  Vaping Use   Vaping Use: Never used  Substance and Sexual Activity   Alcohol use: Not Currently    Alcohol/week: 2.0 standard drinks    Types: 1 Glasses of wine, 1 Shots of liquor per week    Comment: Last ETOH 1 month ago   Drug use: Not Currently   Sexual activity: Yes  Other Topics Concern   Not on file  Social History Narrative   Not on file   Social Determinants of Health   Financial Resource Strain: Not on file  Food Insecurity: Not on file  Transportation Needs: Not on file  Physical Activity: Not on file  Stress: Not on file  Social Connections: Not on file  Intimate Partner Violence: Not At Risk   Fear of Current or Ex-Partner: No   Emotionally Abused: No   Physically Abused: No   Sexually Abused: No    Allergies:  Allergies  Allergen Reactions   Mango Butter Itching    Mangos    Medications: Prior to Admission medications  Not on File    Physical Exam Vitals: Blood pressure 126/84, weight 225 lb (102.1 kg), last menstrual period 04/04/2021, not currently breastfeeding.  General: NAD HEENT: normocephalic, anicteric Thyroid: no enlargement, no palpable nodules Pulmonary: No increased work of breathing, CTAB Cardiovascular: RRR, distal pulses 2+ Abdomen: NABS, soft, non-tender, non-distended.  Umbilicus without lesions.  No hepatomegaly, splenomegaly or masses palpable. No evidence of hernia  Genitourinary:  External: Normal external female genitalia.  Normal urethral meatus, normal  Bartholin's and Skene's glands.    Vagina: Normal vaginal mucosa, no evidence of prolapse.    Cervix: Grossly normal in appearance, no bleeding    Adnexa: ovaries non-enlarged, no adnexal masses  Rectal: deferred Extremities: no edema, erythema, or tenderness Neurologic: Grossly intact Psychiatric: mood appropriate, affect full   Assessment: 25 y.o. G3P1011 at 66w2dpresenting to initiate prenatal care Genetic screening Screening for  sexually transmitted infection Screening for cervical cancer Screening for diabetes    Plan: 1) Avoid alcoholic beverages. 2) Patient encouraged not to smoke.  3) Discontinue the use of all non-medicinal drugs and chemicals.  4) Take prenatal vitamins daily. Start baby ASA 5) Nutrition, food safety (fish, cheese advisories, and high nitrite foods) and exercise discussed. 6) Hospital and practice style discussed with cross coverage system.  7) Genetic Screening, such as with 1st Trimester Screening, cell free fetal DNA, AFP testing, and Ultrasound, as well as with amniocentesis and CVS as appropriate, is discussed with patient. At the conclusion of today's visit patient requested genetic testing 8) Patient is asked about travel to areas at risk for the Zika virus, and counseled to avoid travel and exposure to mosquitoes or sexual partners who may have themselves been exposed to the virus. Testing is discussed, and will be ordered as appropriate.   BMI >=40 [ ] early 1h gtt -  [ ] screen sleep apnea [ ] anesthesia consult (early and late if BMI > 449 [ ] u/s for dating [ ]  [ ] nutritional goals [ ] folic acid 186m[ ] bASA (>12 weeks) [ ] consider nutrition consult [ ] consider maternal EKG 1st trimester [ ] Growth u/s 2814 ], 3234 ], 3654 weeks ] [ ] NST/AFI weekly 34+ weeks (34[],35[],36[], 37[], 38[], 39[], 40[]) [ ] IOL by 41 weeks (scheduled, prn [])    The following were addressed during this visit:  Breastfeeding Education - Early initiation of breastfeeding    Comments: Keeps milk supply adequate, helps contract uterus and slow bleeding, and early milk is the perfect first food and is easy to digest.   - The importance of exclusive breastfeeding    Comments: Provides antibodies, Lower risk of breast and ovarian cancers, and type-2 diabetes,Helps your body recover, Reduced chance of SIDS.   - Risks of giving your baby anything other than breast milk if you are  breastfeeding    Comments: Make the baby less content with breastfeeds, may make my baby more susceptible to illness, and may reduce my milk supply.   - Exclusive breastfeeding for the first 6 months    Comments: Builds a healthy milk supply and keeps it up, protects baby from sickness and disease, and breastmilk has everything your baby needs for the first 6 months.  - Individualized Education    Comments: Contraindications to breastfeeding and other special medical conditions    LyRoberto ScalesCNIrvingCoBox [place] 07/06/2021, 11:10 AM

## 2021-07-08 ENCOUNTER — Telehealth: Payer: Self-pay | Admitting: Licensed Practical Nurse

## 2021-07-08 LAB — CBC/D/PLT+RPR+RH+ABO+RUBIGG...
Antibody Screen: NEGATIVE
Basophils Absolute: 0 10*3/uL (ref 0.0–0.2)
Basos: 0 %
EOS (ABSOLUTE): 0.2 10*3/uL (ref 0.0–0.4)
Eos: 3 %
HCV Ab: <0.1 {s_co_ratio} (ref 0.0–0.9)
HIV Screen 4th Generation wRfx: NONREACTIVE
Hematocrit: 34.8 % (ref 34.0–46.6)
Hemoglobin: 11.5 g/dL (ref 11.1–15.9)
Hepatitis B Surface Ag: NEGATIVE
Immature Grans (Abs): 0 10*3/uL (ref 0.0–0.1)
Immature Granulocytes: 0 %
Lymphocytes Absolute: 1.5 10*3/uL (ref 0.7–3.1)
Lymphs: 28 %
MCH: 27.4 pg (ref 26.6–33.0)
MCHC: 33 g/dL (ref 31.5–35.7)
MCV: 83 fL (ref 79–97)
Monocytes Absolute: 0.5 10*3/uL (ref 0.1–0.9)
Monocytes: 9 %
Neutrophils Absolute: 3.2 10*3/uL (ref 1.4–7.0)
Neutrophils: 60 %
Platelets: 227 10*3/uL (ref 150–450)
RBC: 4.19 x10E6/uL (ref 3.77–5.28)
RDW: 14.4 % (ref 11.7–15.4)
RPR Ser Ql: NONREACTIVE
Rh Factor: POSITIVE
Rubella Antibodies, IGG: <0.9 {index} — ABNORMAL LOW (ref >0.99)
Varicella zoster IgG: 209 {index} (ref >165)
WBC: 5.4 10*3/uL (ref 3.4–10.8)

## 2021-07-08 LAB — DRUG SCREEN, URINE
Amphetamines, Urine: NEGATIVE ng/mL
Barbiturate screen, urine: NEGATIVE ng/mL
Benzodiazepine Quant, Ur: NEGATIVE ng/mL
Cannabinoid Quant, Ur: POSITIVE ng/mL — AB
Cocaine (Metab.): NEGATIVE ng/mL
Opiate Quant, Ur: NEGATIVE ng/mL
PCP Quant, Ur: NEGATIVE ng/mL

## 2021-07-08 LAB — HEMOGLOBIN A1C
Est. average glucose Bld gHb Est-mCnc: 117 mg/dL
Hgb A1c MFr Bld: 5.7 % — ABNORMAL HIGH (ref 4.8–5.6)

## 2021-07-08 LAB — HGB FRACTIONATION CASCADE
Hgb A2: 2.5 % (ref 1.8–3.2)
Hgb A: 97.5 % (ref 96.4–98.8)
Hgb F: 0 % (ref 0.0–2.0)
Hgb S: 0 %

## 2021-07-08 LAB — HCV INTERPRETATION

## 2021-07-08 NOTE — Telephone Encounter (Signed)
LVM to make [facility] aware of her HA1C of 5.7, an early one hour glucose test is need to screen for diabetes.  Please ask for the sugar beverage at your next appointment.  Attempted to send mychart message but unable to do so.

## 2021-07-09 LAB — CULTURE, URINE COMPREHENSIVE

## 2021-07-11 LAB — MATERNIT 21 PLUS CORE, BLOOD
Fetal Fraction: 16
Result (T21): NEGATIVE
Trisomy 13 (Patau syndrome): NEGATIVE
Trisomy 18 (Edwards syndrome): NEGATIVE
Trisomy 21 (Down syndrome): NEGATIVE

## 2021-07-13 LAB — CYTOLOGY - PAP
Chlamydia: NEGATIVE
Comment: NEGATIVE
Comment: NEGATIVE
Comment: NORMAL
Diagnosis: UNDETERMINED — AB
Neisseria Gonorrhea: NEGATIVE
Trichomonas: POSITIVE — AB

## 2021-07-15 ENCOUNTER — Other Ambulatory Visit: Payer: Self-pay | Admitting: Licensed Practical Nurse

## 2021-07-15 DIAGNOSIS — Z124 Encounter for screening for malignant neoplasm of cervix: Secondary | ICD-10-CM

## 2021-07-15 MED ORDER — METRONIDAZOLE 500 MG PO TABS: ORAL_TABLET | ORAL | 0 refills | Status: DC

## 2021-07-15 NOTE — Progress Notes (Signed)
TC to [facility] Reviewed ASCUS on Pap HPV testing has been added to lab, we will make a plan based on HPV results.   Your pap also showed positive for trich, which is sexually transmitted infection. You and your partner both need treatment.  Please abstain from sex until 1 week after you both complete treatment. Verbalizes understanding, appropriately disappointed in result.   Script sent to [facility] on [address].   Carie Caddy, CNM  Domingo Pulse, [facility]  07/15/21  3:54 PM

## 2021-07-24 NOTE — L&D Delivery Note (Signed)
Date of delivery: 01/07/2022 Estimated Date of Delivery: 01/09/22 Patient's last menstrual period was 04/04/2021 (exact date). EGA: [redacted]w[redacted]d  Delivery Note At 3:37 PM a viable female was delivered via VBAC, Spontaneous  Presentation:  OA, LOA     APGAR: 7, 8    Weight: 7 pounds 3 ounces  Placenta status: Spontaneous, Intact.   Cord: 3 vessels with the following complications: None.  Cord pH: NA  Called to see patient.  Mom pushed to deliver a viable female infant.  The head followed by shoulders, which delivered without difficulty, and the rest of the body.  No nuchal cord noted. Thick meconium at delivery. Spontaneous breathing and able to transition initially on mom's chest Cord clamped and cut after 3 min delay.  Cord blood obtained. Newborn to warmer for observation/bulb suctioning and then back to mom. Placenta delivered spontaneously, intact, with a 3-vessel cord.   All counts correct.  Hemostasis obtained with IV pitocin and fundal massage.   Anesthesia: Epidural Episiotomy: None Lacerations: small hemostatic abrasions, right labial/periurethral  Suture Repair:  NA Est. Blood Loss (mL):  200   Mom to postpartum.  Baby to Couplet care / Skin to Skin.   Tresea Mall, CNM 01/07/2022, 4:17 PM

## 2021-08-08 ENCOUNTER — Ambulatory Visit
Admission: RE | Admit: 2021-08-08 | Discharge: 2021-08-08 | Disposition: A | Discharge status: Home / Self Care | Payer: Medicaid Other | Source: Ambulatory Visit | Attending: Licensed Practical Nurse | Admitting: Licensed Practical Nurse

## 2021-08-08 ENCOUNTER — Other Ambulatory Visit: Payer: Self-pay

## 2021-08-08 ENCOUNTER — Other Ambulatory Visit: Payer: Self-pay | Admitting: Licensed Practical Nurse

## 2021-08-08 DIAGNOSIS — O0990 Supervision of high risk pregnancy, unspecified, unspecified trimester: Secondary | ICD-10-CM | POA: Insufficient documentation

## 2021-08-08 DIAGNOSIS — Z3689 Encounter for other specified antenatal screening: Secondary | ICD-10-CM

## 2021-08-10 ENCOUNTER — Encounter: Payer: Medicaid Other | Admitting: Obstetrics

## 2021-08-10 DIAGNOSIS — A599 Trichomoniasis, unspecified: Secondary | ICD-10-CM

## 2021-08-10 HISTORY — DX: Trichomoniasis, unspecified: A59.9

## 2021-08-12 ENCOUNTER — Encounter: Payer: Self-pay | Admitting: Advanced Practice Midwife

## 2021-08-12 ENCOUNTER — Other Ambulatory Visit: Payer: Self-pay

## 2021-08-12 ENCOUNTER — Ambulatory Visit (INDEPENDENT_AMBULATORY_CARE_PROVIDER_SITE_OTHER): Payer: Medicaid Other | Admitting: Advanced Practice Midwife

## 2021-08-12 VITALS — BP 120/80 | Wt 221.0 lb

## 2021-08-12 DIAGNOSIS — O0992 Supervision of high risk pregnancy, unspecified, second trimester: Secondary | ICD-10-CM

## 2021-08-12 DIAGNOSIS — O99210 Obesity complicating pregnancy, unspecified trimester: Secondary | ICD-10-CM | POA: Insufficient documentation

## 2021-08-12 DIAGNOSIS — Z3A18 18 weeks gestation of pregnancy: Secondary | ICD-10-CM

## 2021-08-12 DIAGNOSIS — Z369 Encounter for antenatal screening, unspecified: Secondary | ICD-10-CM

## 2021-08-12 DIAGNOSIS — O99212 Obesity complicating pregnancy, second trimester: Secondary | ICD-10-CM

## 2021-08-12 LAB — POCT URINALYSIS DIPSTICK OB
Glucose, UA: NEGATIVE
POC,PROTEIN,UA: NEGATIVE

## 2021-08-12 NOTE — Progress Notes (Signed)
Routine Prenatal Care Visit  Subjective  Cindy Rodriguez is a 26 y.o. G3P1011 at [redacted]w[redacted]d being seen today for ongoing prenatal care.  She is currently monitored for the following issues for this high-risk pregnancy and has Morbid obesity ([place]); History of COVID-19; Tobacco use in pregnancy, antepartum; Marijuana use; +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 MJ; Trichomonal vaginitis in pregnancy in second trimester; Abnormal chromosomal and genetic finding on antenatal screening mother; Noncompliant pregnant patient no prenatal care x 16 wks; Pregnancy affected by fetal growth restriction; IUGR (intrauterine growth restriction) affecting care of mother, third trimester, fetus 1; IUGR (intrauterine growth restriction) affecting care of mother, third trimester, not applicable or unspecified fetus; Delivery of first pregnancy by cesarean section using transverse incision of lower segment of uterus; Encounter for procreative genetic counseling; Positive result on maternal serum screen for neural tube defects; Supervision of high risk pregnancy, antepartum; Trichomonas infection; and Obesity affecting pregnancy on their problem list.  ----------------------------------------------------------------------------------- Patient reports no complaints.   Contractions: Not present. Vag. Bleeding: None.  Movement: Present. Leaking Fluid denies.  ----------------------------------------------------------------------------------- The following portions of the patient's history were reviewed and updated as appropriate: allergies, current medications, past family history, past medical history, past social history, past surgical history and problem list. Problem list updated.  Objective  Blood pressure 120/80, weight 221 lb (100.2 kg), last menstrual period 04/04/2021, not currently breastfeeding. Pregravid weight 225 lb (102.1 kg) Total Weight Gain -4 lb (-1.814 kg) Urinalysis: Urine Protein    Urine Glucose    Fetal Status: Fetal Heart Rate  (bpm): 140 Fundal Height: 19 cm Movement: Present     General:  Alert, oriented and cooperative. Patient is in no acute distress.  Skin: Skin is warm and dry. No rash noted.   Cardiovascular: Normal heart rate noted  Respiratory: Normal respiratory effort, no problems with respiration noted  Abdomen: Soft, gravid, appropriate for gestational age. Pain/Pressure: Absent     Pelvic:  Cervical exam deferred        Extremities: Normal range of motion.  Edema: None  Mental Status: Normal mood and affect. Normal behavior. Normal judgment and thought content.   Assessment   26 y.o. G3P1011 at [redacted]w[redacted]d by  01/09/2022, by Last Menstrual Period presenting for routine prenatal visit  Plan   pregnancy Problems (from 07/06/21 to present)    Problem Noted Resolved   Obesity affecting pregnancy 08/12/2021 by Rod Can, CNM No   Trichomonas infection 08/10/2021 by Imagene Riches, CNM No   Supervision of high risk pregnancy, antepartum 07/06/2021 by Allen Derry, CNM No   Overview Signed 07/06/2021 11:09 AM by Allen Derry, [place] Staff Provider  Office Location  [facility] Dating  [place] in ED on 11/22  Language  English  Anatomy US    Flu Vaccine  declined Genetic Screen  NIPS:   TDaP vaccine    Hgb A1C or  GTT Early : Third trimester :   Covid    LAB RESULTS   Rhogam   Blood Type     Feeding Plan Formula Antibody    Contraception  Rubella    Circumcision  RPR     Pediatrician   HBsAg     Support Person David HIV    Prenatal Classes  Varicella     GBS  (For PCN allergy, check sensitivities)   BTL Consent     VBAC Consent  Pap      Hgb Electro    Pelvis Tested  CF  SMA         BMI >=40 [ ]  early 1h gtt -  [ ]  screen sleep apnea [ ]  anesthesia consult (early and late if BMI > 45) [ ]  u/s for dating [ ]   [ ]  nutritional goals [ ]  folic acid 1mg  [x ] bASA (>12 weeks) [ ]  consider nutrition consult [ ]  consider maternal EKG 1st trimester [ ]  Growth u/s 28  [ ] , 32 [ ] , 36 weeks [ ]  [ ]  NST/AFI weekly 34+ weeks (34[] ,35[] ,36[] , 37[] , 38[] , 39[] , 40[] ) [ ]  IOL by 41 weeks (scheduled, prn [] )          Preterm labor symptoms and general obstetric precautions including but not limited to vaginal bleeding, contractions, leaking of fluid and fetal movement were reviewed in detail with the patient.   Return in about 4 weeks (around 09/09/2021) for follow up anatomy scan and rob after.  Rod Can, CNM 08/12/2021 1:45 PM

## 2021-09-08 ENCOUNTER — Encounter: Payer: Self-pay | Admitting: Obstetrics and Gynecology

## 2021-09-08 ENCOUNTER — Inpatient Hospital Stay
Admission: EM | Admit: 2021-09-08 | Discharge: 2021-09-08 | Disposition: A | Discharge status: Home / Self Care | Payer: Medicaid Other | Attending: Obstetrics & Gynecology | Admitting: Obstetrics & Gynecology

## 2021-09-08 ENCOUNTER — Other Ambulatory Visit: Payer: Self-pay

## 2021-09-08 DIAGNOSIS — R102 Pelvic and perineal pain: Secondary | ICD-10-CM | POA: Diagnosis present

## 2021-09-08 DIAGNOSIS — O99212 Obesity complicating pregnancy, second trimester: Secondary | ICD-10-CM | POA: Diagnosis not present

## 2021-09-08 DIAGNOSIS — Z3A22 22 weeks gestation of pregnancy: Secondary | ICD-10-CM | POA: Diagnosis not present

## 2021-09-08 DIAGNOSIS — O26892 Other specified pregnancy related conditions, second trimester: Secondary | ICD-10-CM | POA: Insufficient documentation

## 2021-09-08 DIAGNOSIS — A599 Trichomoniasis, unspecified: Secondary | ICD-10-CM

## 2021-09-08 DIAGNOSIS — N949 Unspecified condition associated with female genital organs and menstrual cycle: Secondary | ICD-10-CM

## 2021-09-08 DIAGNOSIS — Z8616 Personal history of COVID-19: Secondary | ICD-10-CM | POA: Diagnosis not present

## 2021-09-08 DIAGNOSIS — O34219 Maternal care for unspecified type scar from previous cesarean delivery: Secondary | ICD-10-CM | POA: Insufficient documentation

## 2021-09-08 DIAGNOSIS — O0990 Supervision of high risk pregnancy, unspecified, unspecified trimester: Secondary | ICD-10-CM

## 2021-09-08 NOTE — Final Progress Note (Signed)
Physician Final Progress Note  Patient ID: Cindy Rodriguez MRN: [id number] DOB/AGE: [birth year] 25 y.o.  Admit date: 09/08/2021 Admitting provider: Nadara Mustard, MD Discharge date: 09/08/2021  Admission Diagnoses: Round Ligament Pain 22 weeks  Discharge Diagnoses:  Principal Problem:   Round ligament pain  Consults: None  Significant Findings/ Diagnostic Studies:  Obstetrics Admission History & Physical   CC: lower groin pains  HPI:  26 y.o. G3P1011 @ [redacted]w[redacted]d (01/09/2022, by Last Menstrual Period). Admitted on 09/08/2021:   Patient Active Problem List   Diagnosis Date Noted   Round ligament pain 09/08/2021   Obesity affecting pregnancy 08/12/2021   Trichomonas infection 08/10/2021   Supervision of high risk pregnancy, antepartum 07/06/2021   IUGR (intrauterine growth restriction) affecting care of mother, third trimester, not applicable or unspecified fetus 11/01/2019   Delivery of first pregnancy by cesarean section using transverse incision of lower segment of uterus    IUGR (intrauterine growth restriction) affecting care of mother, third trimester, fetus 1 10/30/2019   Pregnancy affected by fetal growth restriction 10/20/2019   Noncompliant pregnant patient no prenatal care x 16 wks 08/27/2019   Encounter for procreative genetic counseling 06/10/2019   Positive result on maternal serum screen for neural tube defects 06/10/2019   Abnormal chromosomal and genetic finding on antenatal screening mother 06/09/2019   Trichomonal vaginitis in pregnancy in second trimester 05/08/2019   History of COVID-19 05/05/2019   Tobacco use in pregnancy, antepartum 05/05/2019   Marijuana use; +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 MJ 05/05/2019   Morbid obesity (HCC) 10/18/2017     Presents for lower groin pains for a few days.   No VB.   Prenatal care at: at [facility]. Pregnancy complicated by none.  ROS: A review of systems was performed and negative, except as stated in the above HPI. PMHx:  Past Medical  History:  Diagnosis Date   Dyspnea 03/27/2019   + Covid-19 (03/23/2019)   PSHx:  Past Surgical History:  Procedure Laterality Date   CESAREAN SECTION N/A 11/01/2019   Procedure: CESAREAN SECTION;  Surgeon: Natale Milch, MD;  Location: [facility] ORS;  Service: Obstetrics;  Laterality: N/A;   Medications:  Medications Prior to Admission  Medication Sig Dispense Refill Last Dose   metroNIDAZOLE (FLAGYL) 500 MG tablet Take two tablets by mouth twice a day, for one day.  Or you can take all four tablets at once if you can tolerate it. 4 tablet 0    Allergies: is allergic to mango butter. OBHx:  OB History  Gravida Para Term Preterm AB Living  3 1 1  0 1 1  SAB IAB Ectopic Multiple Live Births  1 0 0 0 1    # Outcome Date GA Lbr Len/2nd Weight Sex Delivery Anes PTL Lv  3 Current           2 Term 11/01/19 [redacted]w[redacted]d  2170 g F CS-LTranv Spinal  LIV  1 SAB            [redacted]w[redacted]d except as detailed in HPI.YIR:SWNIOEVO/[id number]  No family history of birth defects. Soc Hx: Alcohol: none and Recreational drug use: none  Objective:   Vitals:   09/08/21 1402  BP: (!) 108/59  Pulse: 78  Resp: 16  Temp: 98.3 F (36.8 C)   Constitutional: Well nourished, well developed female in no acute distress.  HEENT: normal Skin: Warm and dry.  Cardiovascular:Regular rate and rhythm.   Extremity: trace to 1+ bilateral pedal edema Respiratory: Clear to auscultation bilateral. Normal respiratory effort Abdomen: gravid,  ND, FHT present, mild tenderness on exam Back: no CVAT Neuro: DTRs 2+, Cranial nerves grossly intact Psych: Alert and Oriented x3. No memory deficits. Normal mood and affect.  MS: normal gait, normal bilateral lower extremity ROM/strength/stability.  Assessment & Plan:   26 y.o. G3P1011 @ [redacted]w[redacted]d, Admitted on 09/08/2021:Round Ligament pain    Procedures: FHT 130s  Discharge Condition: good  Disposition: Discharge disposition: 01-Home or Self Care       Diet: Regular  diet  Discharge Activity: Activity as tolerated  Discharge Instructions     Call MD for:   Complete by: As directed    Worsening contractions or pain; leakage of fluid; bleeding.   Diet - low sodium heart healthy   Complete by: As directed    Increase activity slowly   Complete by: As directed       Allergies as of 09/08/2021       Reactions   Mango Butter Itching   Mangos        Medication List     STOP taking these medications    metroNIDAZOLE 500 MG tablet Commonly known as: FLAGYL        Follow-up Information     Tresea Mall, CNM Follow up.   Specialty: Obstetrics Why: Tomorrow Contact information: [address] [place] [address] [phone]                 Total time spent taking care of this patient: 20 minutes  Signed: Letitia Libra 09/08/2021, 2:40 PM

## 2021-09-08 NOTE — OB Triage Note (Signed)
Pt G3P1 [redacted]w[redacted]d presents for intermittent sharp shooting pain in her pelvis. Pt reports is mostly with movement. Denies LOF, bleeding, ctx. +FM. VSS. FHT 145

## 2021-09-08 NOTE — Discharge Summary (Signed)
  See FPN 

## 2021-09-09 ENCOUNTER — Encounter: Payer: Self-pay | Admitting: Advanced Practice Midwife

## 2021-09-09 ENCOUNTER — Ambulatory Visit (INDEPENDENT_AMBULATORY_CARE_PROVIDER_SITE_OTHER): Payer: Medicaid Other | Admitting: Advanced Practice Midwife

## 2021-09-09 VITALS — BP 120/80 | Wt 228.0 lb

## 2021-09-09 DIAGNOSIS — O0992 Supervision of high risk pregnancy, unspecified, second trimester: Secondary | ICD-10-CM

## 2021-09-09 DIAGNOSIS — Z3A22 22 weeks gestation of pregnancy: Secondary | ICD-10-CM

## 2021-09-09 DIAGNOSIS — O99212 Obesity complicating pregnancy, second trimester: Secondary | ICD-10-CM

## 2021-09-09 LAB — POCT URINALYSIS DIPSTICK OB
Glucose, UA: NEGATIVE
POC,PROTEIN,UA: NEGATIVE

## 2021-09-09 NOTE — Progress Notes (Signed)
Routine Prenatal Care Visit  Subjective  Cindy Rodriguez is a 26 y.o. G3P1011 at [redacted]w[redacted]d being seen today for ongoing prenatal care.  She is currently monitored for the following issues for this high-risk pregnancy and has Morbid obesity ([place]); History of COVID-19; Tobacco use in pregnancy, antepartum; Marijuana use; +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 MJ; Trichomonal vaginitis in pregnancy in second trimester; Delivery of first pregnancy by cesarean section using transverse incision of lower segment of uterus; Supervision of high risk pregnancy, antepartum; Trichomonas infection; Obesity affecting pregnancy; and Round ligament pain on their problem list.  ----------------------------------------------------------------------------------- Patient reports no complaints.   Contractions: Not present. Vag. Bleeding: None.  Movement: Present. Leaking Fluid denies.  ----------------------------------------------------------------------------------- The following portions of the patient's history were reviewed and updated as appropriate: allergies, current medications, past family history, past medical history, past social history, past surgical history and problem list. Problem list updated.  Objective  Blood pressure 120/80, weight 228 lb (103.4 kg), last menstrual period 04/04/2021, not currently breastfeeding. Pregravid weight 225 lb (102.1 kg) Total Weight Gain 3 lb (1.361 kg) Urinalysis: Urine Protein    Urine Glucose    Fetal Status: Fetal Heart Rate (bpm): 148 Fundal Height: 24 cm Movement: Present     General:  Alert, oriented and cooperative. Patient is in no acute distress.  Skin: Skin is warm and dry. No rash noted.   Cardiovascular: Normal heart rate noted  Respiratory: Normal respiratory effort, no problems with respiration noted  Abdomen: Soft, gravid, appropriate for gestational age. Pain/Pressure: Absent     Pelvic:  Cervical exam deferred        Extremities: Normal range of motion.  Edema: None  Mental  Status: Normal mood and affect. Normal behavior. Normal judgment and thought content.   Assessment   26 y.o. G3P1011 at [redacted]w[redacted]d by  01/09/2022, by Last Menstrual Period presenting for routine prenatal visit  Plan   pregnancy Problems (from 07/06/21 to present)    Problem Noted Resolved   Round ligament pain 09/08/2021 by Gae Dry, MD No   Obesity affecting pregnancy 08/12/2021 by Rod Can, CNM No   Trichomonas infection 08/10/2021 by Imagene Riches, CNM No   Supervision of high risk pregnancy, antepartum 07/06/2021 by Allen Derry, CNM No   Overview Signed 07/06/2021 11:09 AM by Allen Derry, [place] Staff Provider  Office Location  [facility] Dating  [place] in ED on 11/22  Language  English  Anatomy US    Flu Vaccine  declined Genetic Screen  NIPS:   TDaP vaccine    Hgb A1C or  GTT Early : Third trimester :   Covid    LAB RESULTS   Rhogam   Blood Type     Feeding Plan Formula Antibody    Contraception  Rubella    Circumcision  RPR     Pediatrician   HBsAg     Support Person David HIV    Prenatal Classes  Varicella     GBS  (For PCN allergy, check sensitivities)   BTL Consent     VBAC Consent  Pap      Hgb Electro    Pelvis Tested  CF      SMA         BMI >=40 [ ]  early 1h gtt -  [ ]  screen sleep apnea [ ]  anesthesia consult (early and late if BMI > 45) [ ]  u/s for dating [ ]   [ ]  nutritional goals [ ]   folic acid 1mg  [x ] bASA (>12 weeks) [ ]  consider nutrition consult [ ]  consider maternal EKG 1st trimester [ ]  Growth u/s 28 [ ] , 32 [ ] , 36 weeks [ ]  [ ]  NST/AFI weekly 34+ weeks (34[] ,35[] ,36[] , 37[] , 38[] , 39[] , 40[] ) [ ]  IOL by 41 weeks (scheduled, prn [] )          Preterm labor symptoms and general obstetric precautions including but not limited to vaginal bleeding, contractions, leaking of fluid and fetal movement were reviewed in detail with the patient.   Return in about 4 weeks (around 10/07/2021) for please schedule  follow up anatomy scan asap (already ordered) and rob in 4 weeks.  Rod Can, CNM 09/09/2021 1:36 PM

## 2021-09-14 ENCOUNTER — Telehealth: Payer: Self-pay

## 2021-09-14 NOTE — Telephone Encounter (Signed)
Spoke with pt. Her ex-partner went to doctor today and said his testing was negative. She doesn't understand that. Explained to her that if they did urine STD testing that it isn't as accurate as the vaginal swab she had done. Pt did tx and doesn't plan to be with him again. Suggested he do tx regardless of test results before they're sex active again.

## 2021-09-14 NOTE — Telephone Encounter (Signed)
Pt calling; wants a call back asap regarding her first visit of this second go around with [place].  [phone]  Pt recv'd call from midwife informing her she had trich; hasn't been with the FOB since; he informs her yesterday that he didn't have it; how can that be?

## 2021-09-20 ENCOUNTER — Telehealth: Payer: Self-pay

## 2021-09-20 NOTE — Telephone Encounter (Signed)
Patient is calling to scheduled follow up ultrasound do to the ultrasound not being able to see her baby's lip. Could you please place order for this patient. Please advise

## 2021-09-23 NOTE — Telephone Encounter (Signed)
Patient is scheduled for 09/28/21 for ultrasound

## 2021-09-28 ENCOUNTER — Ambulatory Visit: Admission: RE | Admit: 2021-09-28 | Payer: Medicaid Other | Source: Ambulatory Visit

## 2021-09-30 ENCOUNTER — Other Ambulatory Visit: Payer: Self-pay

## 2021-09-30 ENCOUNTER — Ambulatory Visit
Admission: RE | Admit: 2021-09-30 | Discharge: 2021-09-30 | Disposition: A | Discharge status: Home / Self Care | Payer: Medicaid Other | Source: Ambulatory Visit | Attending: Advanced Practice Midwife | Admitting: Advanced Practice Midwife

## 2021-09-30 DIAGNOSIS — Z369 Encounter for antenatal screening, unspecified: Secondary | ICD-10-CM | POA: Diagnosis present

## 2021-09-30 DIAGNOSIS — O0992 Supervision of high risk pregnancy, unspecified, second trimester: Secondary | ICD-10-CM

## 2021-10-07 ENCOUNTER — Encounter: Payer: Medicaid Other | Admitting: Licensed Practical Nurse

## 2021-10-20 ENCOUNTER — Ambulatory Visit (INDEPENDENT_AMBULATORY_CARE_PROVIDER_SITE_OTHER): Payer: Medicaid Other | Admitting: Obstetrics

## 2021-10-20 VITALS — BP 122/70 | Wt 229.0 lb

## 2021-10-20 DIAGNOSIS — Z3A28 28 weeks gestation of pregnancy: Secondary | ICD-10-CM

## 2021-10-20 DIAGNOSIS — O0992 Supervision of high risk pregnancy, unspecified, second trimester: Secondary | ICD-10-CM

## 2021-10-20 NOTE — Progress Notes (Signed)
Routine Prenatal Care Visit ? ?Subjective  ?Cindy Rodriguez is a 26 y.o. G3P1011 at [redacted]w[redacted]d being seen today for ongoing prenatal care.  She is currently monitored for the following issues for this high-risk pregnancy and has Morbid obesity ([place]); History of COVID-19; Tobacco use in pregnancy, antepartum; Marijuana use; +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 MJ; Trichomonal vaginitis in pregnancy in second trimester; Delivery of first pregnancy by cesarean section using transverse incision of lower segment of uterus; Supervision of high risk pregnancy, antepartum; Trichomonas infection; Obesity affecting pregnancy; and Round ligament pain on their problem list.  ?----------------------------------------------------------------------------------- ?Patient reports no complaints.  She missed hr 28 week appointment and labs. She is a smoker who has cut back to 2 cigs daily. ?Contractions: Not present. Vag. Bleeding: None.  Movement: Present. Leaking Fluid denies.  ?----------------------------------------------------------------------------------- ?The following portions of the patient's history were reviewed and updated as appropriate: allergies, current medications, past family history, past medical history, past social history, past surgical history and problem list. Problem list updated. ? ?Objective  ?Blood pressure 122/70, weight 229 lb (103.9 kg), last menstrual period 04/04/2021, not currently breastfeeding. ?Pregravid weight 225 lb (102.1 kg) Total Weight Gain 4 lb (1.814 kg) ?Urinalysis: Urine Protein    Urine Glucose   ? ?Fetal Status:     Movement: Present    ? ?General:  Alert, oriented and cooperative. Patient is in no acute distress.  ?Skin: Skin is warm and dry. No rash noted.   ?Cardiovascular: Normal heart rate noted  ?Respiratory: Normal respiratory effort, no problems with respiration noted  ?Abdomen: Soft, gravid, appropriate for gestational age. Pain/Pressure: Absent     ?Pelvic:  Cervical exam deferred        ?Extremities:  Normal range of motion.     ?Mental Status: Normal mood and affect. Normal behavior. Normal judgment and thought content.  ? ?Assessment  ? ?26 y.o. G3P1011 at [redacted]w[redacted]d by  01/09/2022, by Last Menstrual Period presenting for routine prenatal visit ? ?Plan  ? ?pregnancy Problems (from 07/06/21 to present)   ? Problem Noted Resolved  ? Round ligament pain 09/08/2021 by Gae Dry, MD No  ? Obesity affecting pregnancy 08/12/2021 by Rod Can, CNM No  ? Trichomonas infection 08/10/2021 by Imagene Riches, CNM No  ? Supervision of high risk pregnancy, antepartum 07/06/2021 by Allen Derry, CNM No  ? Overview Signed 07/06/2021 11:09 AM by Allen Derry, CNM  ?   ?Nursing Staff Provider  ?Office Location  [facility] Dating  [place] in ED on 11/22  ?Language  English  Anatomy US    ?Flu Vaccine  declined Genetic Screen  NIPS:   ?TDaP vaccine    Hgb A1C or  ?GTT Early : ?Third trimester :   ?Covid    LAB RESULTS   ?Rhogam   Blood Type     ?Feeding Plan Formula Antibody    ?Contraception  Rubella    ?Circumcision  RPR     ?Pediatrician   HBsAg     ?Support Person Shanon Brow HIV    ?Prenatal Classes  Varicella   ?  GBS  (For PCN allergy, check sensitivities)   ?BTL Consent     ?VBAC Consent  Pap    ?  Hgb Electro    ?Pelvis Tested  CF   ?   SMA   ?     ? ?BMI >=40 ?[ ]  early 1h gtt -  ?[ ]  screen sleep apnea ?[ ]  anesthesia consult (early and late if BMI > 45) ?[ ]   u/s for dating [ ]   ?[ ]  nutritional goals ?[ ]  folic acid 1mg  ?[x ] bASA (>12 weeks) ?[ ]  consider nutrition consult ?[ ]  consider maternal EKG 1st trimester ?[ ]  Growth u/s 28 [ ] , 32 [ ] , 36 weeks [ ]  ?[ ]  NST/AFI weekly 34+ weeks (34[] ,35[] ,36[] , 37[] , 38[] , 39[] , 40[] ) ?[ ]  IOL by 41 weeks (scheduled, prn [] ) ?  ?  ?  ?  ? ?Preterm labor symptoms and general obstetric precautions including but not limited to vaginal bleeding, contractions, leaking of fluid and fetal movement were reviewed in detail with the patient. ?Please refer to After Visit Summary  for other counseling recommendations. ?Discussed the importance of the 28 week labs. She will RTC next wk for the lab visit, and then 2 week out for herROB ?Imagene Riches, CNM  ?10/20/2021 3:55 PM  ?  ? ?Return in about 1 week (around 10/27/2021) for return OB. ? ?Imagene Riches, CNM  ?10/20/2021 3:48 PM   ? ?

## 2021-10-20 NOTE — Progress Notes (Signed)
No vb. No lof.  

## 2021-10-21 ENCOUNTER — Encounter: Payer: Medicaid Other | Admitting: Obstetrics and Gynecology

## 2021-10-25 ENCOUNTER — Other Ambulatory Visit: Payer: Medicaid Other

## 2021-10-25 DIAGNOSIS — O0992 Supervision of high risk pregnancy, unspecified, second trimester: Secondary | ICD-10-CM

## 2021-10-26 LAB — 28 WEEK RH+PANEL
Basophils Absolute: 0 10*3/uL (ref 0.0–0.2)
Basos: 0 %
EOS (ABSOLUTE): 0.1 10*3/uL (ref 0.0–0.4)
Eos: 2 %
Gestational Diabetes Screen: 68 mg/dL — ABNORMAL LOW (ref 70–139)
HIV Screen 4th Generation wRfx: NONREACTIVE
Hematocrit: 32.9 % — ABNORMAL LOW (ref 34.0–46.6)
Hemoglobin: 10.5 g/dL — ABNORMAL LOW (ref 11.1–15.9)
Immature Grans (Abs): 0.1 10*3/uL (ref 0.0–0.1)
Immature Granulocytes: 1 %
Lymphocytes Absolute: 1.3 10*3/uL (ref 0.7–3.1)
Lymphs: 17 %
MCH: 27.2 pg (ref 26.6–33.0)
MCHC: 31.9 g/dL (ref 31.5–35.7)
MCV: 85 fL (ref 79–97)
Monocytes Absolute: 0.7 10*3/uL (ref 0.1–0.9)
Monocytes: 9 %
Neutrophils Absolute: 5.5 10*3/uL (ref 1.4–7.0)
Neutrophils: 71 %
Platelets: 202 10*3/uL (ref 150–450)
RBC: 3.86 x10E6/uL (ref 3.77–5.28)
RDW: 13.5 % (ref 11.7–15.4)
RPR Ser Ql: NONREACTIVE
WBC: 7.7 10*3/uL (ref 3.4–10.8)

## 2021-11-02 ENCOUNTER — Ambulatory Visit (INDEPENDENT_AMBULATORY_CARE_PROVIDER_SITE_OTHER): Payer: Medicaid Other | Admitting: Obstetrics and Gynecology

## 2021-11-02 ENCOUNTER — Encounter: Payer: Self-pay | Admitting: Obstetrics and Gynecology

## 2021-11-02 VITALS — BP 120/62 | Wt 233.0 lb

## 2021-11-02 DIAGNOSIS — O0990 Supervision of high risk pregnancy, unspecified, unspecified trimester: Secondary | ICD-10-CM

## 2021-11-02 DIAGNOSIS — O99213 Obesity complicating pregnancy, third trimester: Secondary | ICD-10-CM

## 2021-11-02 MED ORDER — ASPIRIN EC 81 MG PO TBEC: 81.0000 mg | DELAYED_RELEASE_TABLET | Freq: Every day | ORAL | 2 refills | Status: DC

## 2021-11-02 MED ORDER — VITAFOL GUMMIES 3.33-0.333-34.8 MG PO CHEW: 2.0000 | CHEWABLE_TABLET | Freq: Every day | ORAL | 6 refills | Status: AC

## 2021-11-02 NOTE — Progress Notes (Signed)
? ?  PRENATAL VISIT NOTE ? ?Subjective:  ?Cindy Rodriguez is a 26 y.o. G3P1011 at [redacted]w[redacted]d being seen today for ongoing prenatal care.  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 ? ?Patient reports no complaints.  Contractions: Not present. Vag. Bleeding: None.  Movement: Present. Denies leaking of fluid.  ? ?The following portions of the patient's history were reviewed and updated as appropriate: allergies, current medications, past family history, past medical history, past social history, past surgical history and problem list.  ? ?Objective:  ? ?Vitals:  ? 11/02/21 1351  ?BP: 120/62  ?Weight: 233 lb (105.7 kg)  ? ? ?Fetal Status: Fetal Heart Rate (bpm): 145 Fundal Height: 30 cm Movement: Present    ? ?General:  Alert, oriented and cooperative. Patient is in no acute distress.  ?Skin: Skin is warm and dry. No rash noted.   ?Cardiovascular: Normal heart rate noted  ?Respiratory: Normal respiratory effort, no problems with respiration noted  ?Abdomen: Soft, gravid, appropriate for gestational age.  Pain/Pressure: Absent     ?Pelvic: Cervical exam deferred        ?Extremities: Normal range of motion.  Edema: None  ?Mental Status: Normal mood and affect. Normal behavior. Normal judgment and thought content.  ? ?Assessment and Plan:  ?Pregnancy: G3P1011 at [redacted]w[redacted]d ?1. Supervision of high risk pregnancy, antepartum ?Patient is doing well without complaints ?Patient undecided on contraception at this time ? ?2. Obesity affecting pregnancy in third trimester ?Rx ASA  prescribed ? ?3. Delivery of first pregnancy by cesarean section using transverse incision of lower segment of uterus ?Risks and benefits of TOLAC vs RCS reviewed with the patient. Patient opted for TOLAC ? ?Preterm labor symptoms and general obstetric precautions including but not limited to vaginal bleeding, contractions, leaking of fluid and fetal movement were reviewed in detail with the patient. ?Please refer to After Visit Summary for other counseling recommendations.  ? ?No follow-ups on file. ? ?No future appointments. ? ?Mora Bellman, MD ? ?

## 2021-11-18 ENCOUNTER — Encounter: Payer: Medicaid Other | Admitting: Obstetrics and Gynecology

## 2021-11-18 ENCOUNTER — Ambulatory Visit (INDEPENDENT_AMBULATORY_CARE_PROVIDER_SITE_OTHER): Payer: Medicaid Other | Admitting: Licensed Practical Nurse

## 2021-11-18 ENCOUNTER — Encounter: Payer: Self-pay | Admitting: Licensed Practical Nurse

## 2021-11-18 VITALS — BP 122/72 | Wt 230.0 lb

## 2021-11-18 DIAGNOSIS — O0990 Supervision of high risk pregnancy, unspecified, unspecified trimester: Secondary | ICD-10-CM

## 2021-11-18 NOTE — Progress Notes (Signed)
Routine Prenatal Care Visit ? ?Subjective  ?Cindy Rodriguez is a 26 y.o. G3P1011 at [redacted]w[redacted]d being seen today for ongoing prenatal care.  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  ?----------------------------------------------------------------------------------- ?Patient reports no complaints.  Doing well.  Mood has been good, is getting good sleep. Unable to give urine today as she just went to the BR prior to the visit.  ?Contractions: Not present. Vag. Bleeding: None.  Movement: Present. Leaking Fluid denies.  ?----------------------------------------------------------------------------------- ?The following portions of the patient's history were reviewed and updated as appropriate: allergies, current medications, past family history, past medical history, past social history, past surgical history and problem list. Problem list updated. ? ?Objective  ?Blood pressure 122/72, weight 230 lb (104.3 kg), last menstrual period 04/04/2021. ?Pregravid weight 225 lb (102.1 kg) Total Weight Gain 5 lb (2.268 kg) ?Urinalysis: Urine Protein    Urine Glucose   ? ?Fetal Status: Fetal Heart Rate (bpm): 143 Fundal Height: 32 cm Movement: Present    ? ?General:  Alert, oriented and cooperative. Patient is in no acute distress.  ?Skin: Skin is warm and dry. No rash noted.   ?Cardiovascular: Normal heart rate noted  ?Respiratory: Normal respiratory effort, no problems with respiration noted  ?Abdomen: Soft, gravid, appropriate for gestational age. Pain/Pressure: Absent     ?Pelvic:  Cervical exam deferred        ?Extremities: Normal range of motion.     ?Mental Status: Normal  mood and affect. Normal behavior. Normal judgment and thought content.  ? ?Assessment  ? ?26 y.o. G3P1011 at [redacted]w[redacted]d by  01/09/2022, by Last Menstrual Period presenting for routine prenatal visit ? ?Plan  ? ?pregnancy Problems (from 07/06/21 to present)   ? ? Problem Noted Resolved  ? Round ligament pain 09/08/2021 by Gae Dry, MD No  ? Obesity affecting pregnancy 08/12/2021 by Rod Can, CNM No  ? Trichomonas infection 08/10/2021 by Imagene Riches, CNM No  ? Supervision of high risk pregnancy, antepartum 07/06/2021 by Allen Derry, CNM No  ? Overview Addendum 11/02/2021  1:17 PM by Mora Bellman, MD  ?   ?Nursing Staff Provider  ?Office Location  [facility] Dating  [place] in ED on 11/22  ?Language  English  Anatomy US  normal  ?Flu Vaccine  declined Genetic Screen  NIPS:   ?TDaP vaccine    Hgb A1C or  ?GTT Early : ?Third trimester : 27  ?Covid    LAB RESULTS   ?Rhogam   Blood Type   O+  ?Feeding Plan Formula Antibody  neg  ?Contraception  Rubella  non immune  ?Circumcision  RPR   neg  ?Pediatrician   HBsAg   neg  ?Support Person Shanon Brow HIV  negative  ?Prenatal Classes  Varicella immune  ?  GBS  (For PCN allergy, check sensitivities)   ?BTL Consent     ?VBAC Consent  Pap    ?  Hgb Electro    ?Pelvis Tested  CF   ?   SMA   ?     ? ?BMI >=40 ?[ ]  early 1h gtt -  ?[ ]  screen sleep apnea ?[ ]  anesthesia consult (early and late if BMI > 45) ?[ ]  u/s  for dating [ ]   ?[ ]  nutritional goals ?[ ]  folic acid 1mg  ?[x ] bASA (>12 weeks) ?[ ]  consider nutrition consult ?[ ]  consider maternal EKG 1st trimester ?[ ]  Growth u/s 28 [ ] , 32 [ ] , 36 weeks [ ]  ?[ ]  NST/AFI weekly 34+ weeks (34[] ,35[] ,36[] , 37[] , 38[] , 39[] , 40[] ) ?[ ]  IOL by 41 weeks (scheduled, prn [] ) ?  ?  ? ?  ?  ? ?Preterm labor symptoms and general obstetric precautions including but not limited to vaginal bleeding, contractions, leaking of fluid and fetal movement were reviewed in detail with the patient. ?Please refer to After Visit Summary for  other counseling recommendations.  ? ?Return in about 2 weeks (around 12/02/2021) for ROB, NST . ? ?Growth [place] ordered ? ?Needs UDS and NST at next visit  ? ?Roberto Scales, CNM  ?Mosetta Pigeon, [facility]  ?11/18/21  ?12:25 PM  ? ? ?

## 2021-11-23 ENCOUNTER — Other Ambulatory Visit: Payer: Self-pay

## 2021-11-23 DIAGNOSIS — O99213 Obesity complicating pregnancy, third trimester: Secondary | ICD-10-CM

## 2021-11-23 DIAGNOSIS — O0990 Supervision of high risk pregnancy, unspecified, unspecified trimester: Secondary | ICD-10-CM

## 2021-11-24 ENCOUNTER — Ambulatory Visit: Payer: Medicaid Other

## 2021-11-24 ENCOUNTER — Ambulatory Visit (INDEPENDENT_AMBULATORY_CARE_PROVIDER_SITE_OTHER): Payer: Medicaid Other

## 2021-11-24 DIAGNOSIS — Z3A33 33 weeks gestation of pregnancy: Secondary | ICD-10-CM | POA: Diagnosis not present

## 2021-11-24 DIAGNOSIS — O99213 Obesity complicating pregnancy, third trimester: Secondary | ICD-10-CM | POA: Diagnosis not present

## 2021-11-24 DIAGNOSIS — O0990 Supervision of high risk pregnancy, unspecified, unspecified trimester: Secondary | ICD-10-CM

## 2021-11-24 DIAGNOSIS — E669 Obesity, unspecified: Secondary | ICD-10-CM | POA: Diagnosis not present

## 2021-11-29 ENCOUNTER — Ambulatory Visit (INDEPENDENT_AMBULATORY_CARE_PROVIDER_SITE_OTHER): Payer: Medicaid Other | Admitting: Obstetrics

## 2021-11-29 ENCOUNTER — Other Ambulatory Visit: Payer: Medicaid Other

## 2021-11-29 VITALS — BP 110/62 | Wt 232.0 lb

## 2021-11-29 DIAGNOSIS — O0990 Supervision of high risk pregnancy, unspecified, unspecified trimester: Secondary | ICD-10-CM | POA: Diagnosis not present

## 2021-11-29 DIAGNOSIS — N898 Other specified noninflammatory disorders of vagina: Secondary | ICD-10-CM

## 2021-11-29 DIAGNOSIS — O99213 Obesity complicating pregnancy, third trimester: Secondary | ICD-10-CM

## 2021-11-29 DIAGNOSIS — Z3A34 34 weeks gestation of pregnancy: Secondary | ICD-10-CM

## 2021-11-29 DIAGNOSIS — F1290 Cannabis use, unspecified, uncomplicated: Secondary | ICD-10-CM

## 2021-11-29 LAB — POCT URINALYSIS DIPSTICK OB
Glucose, UA: NEGATIVE
POC,PROTEIN,UA: NEGATIVE

## 2021-11-29 LAB — FETAL NONSTRESS TEST

## 2021-11-29 NOTE — Patient Instructions (Signed)
34 Week Obstetrics Instructions  PRE-REGISTRATION  Please plan to pre-register at the hospital during the next week.  You can complete the form only by visiting [email or web address]   If you have any questions concerning this please contact the main number at [facility] [phone]  SIGNS OF LABOR  There is never an absolute way for an individual to determine if they ar truly in labor unless they are seen in the office or at the hospital.  However, please follow these guidelines in order to determine if you may potentially be in labor:  A.  Contractions 5 minutes apart for an hour (first baby)  B. Contractions 5 minutes apart for 20-30 minutes (if you have had a baby)  C. If you have any doubts   -If A, B, or C occur, please go to the hospital to be seen in Labor & Delivery [facility] [phone]-the nurse will check you and contact us. Please report to the hospital through the Emergency Room entrance and they will transport you to labor and delivery.  SPONTANEOUS RUPTURE OF MEMBRANES ("Water Breaks")  This is usually evidenced by a gush of fluid from the vagina.  If this occurs during office hours (and you are uncertain if your water has just broken), schedule ana appointment to come into the office to be checked.  After office hours and on weekends, go to the labor and delivery unit where the nurse will check you and contact us.  VAGINAL BLEEDING  A.  "Bloody Show" - this is a thick blood streaked vaginal discharge.  It is normal and should cause no alarm.  B. Heavy vaginal bleeding (similar to that of a period or heavier).  If this occurs, you should go IMMEDIATELY to the labor and delivery unit.  WHEN TO CALL  1. Routine Questions: please make a list of questions you may have and merely ask them at your next visit in the office.  2. Urgent or Worrisome Questions:  During office hours, please  call us at [phone] to leave a message with the nurse and/or provider for assistance.  After hours, the answering system will direct you as to which provider is on call and if your concern requires that they be paged.  If so, follow the instructions on the voicemail and the provider will contact you back at their earliest convenience.  3. Labor or Rupture of Membranes:  This will require an examination.  Please contact the office to schedule an appointment for exam.  After office hours and on the weekends, go to the Labor and Delivery unit at the hospital where the nurse will check you and call us.  You do not need to notify us at night that you are coming.  INSURANCE REGULATIONS/HOSPITAL DISCHARGE It is very important that you understand that not all delivers are the same.  Typically, a vaginal delivery requires a 2 day stay at the hospital and a cesarean delivery requires 4 days in the hospital for recovery.  It is your responsibility to know what time frame your insurance suggests you stay in the hospital for coverage purposes.  However, one must keep in mind that providers use their own judgment and discretion when determining when an individual is able to be discharged.    

## 2021-11-29 NOTE — Progress Notes (Signed)
Routine Prenatal Care Visit ? ?Subjective  ?Cindy Rodriguez is a 26 y.o. G3P1011 at [redacted]w[redacted]d being seen today for ongoing prenatal care.  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  ?----------------------------------------------------------------------------------- ?Patient reports no complaints.  She is having a n NST and it is reactive. She is also noted to be contracting. ?Contractions: Not present. Vag. Bleeding: None.  Movement: Present. Leaking Fluid denies.  ?----------------------------------------------------------------------------------- ?The following portions of the patient's history were reviewed and updated as appropriate: allergies, current medications, past family history, past medical history, past social history, past surgical history and problem list. Problem list updated. ? ?Objective  ?Blood pressure 110/62, weight 232 lb (105.2 kg), last menstrual period 04/04/2021. ?Pregravid weight 225 lb (102.1 kg) Total Weight Gain 7 lb (3.175 kg) ?Urinalysis: Urine Protein Negative  Urine Glucose Negative ? ?Fetal Status:     Movement: Present    ? ?General:  Alert, oriented and cooperative. Patient is in no acute distress.  ?Skin: Skin is warm and dry. No rash noted.   ?Cardiovascular: Normal heart rate noted  ?Respiratory: Normal respiratory effort, no problems with respiration noted  ?Abdomen: Soft, gravid, appropriate for gestational age. Pain/Pressure: Absent     ?Pelvic:  cervix is closed/long/ ballotable.        ?Extremities: Normal range of motion.     ?Mental Status: Normal mood and affect. Normal behavior. Normal judgment and thought content.   ? ?Assessment  ? ?26 y.o. G3P1011 at [redacted]w[redacted]d by  01/09/2022, by Last Menstrual Period presenting for routine prenatal visit ? ?Plan  ? ?pregnancy Problems (from 07/06/21 to present)   ? Problem Noted Resolved  ? Round ligament pain 09/08/2021 by Gae Dry, MD No  ? Obesity affecting pregnancy 08/12/2021 by Rod Can, CNM No  ? Trichomonas infection 08/10/2021 by Imagene Riches, CNM No  ? Supervision of high risk pregnancy, antepartum 07/06/2021 by Allen Derry, CNM No  ? Overview Addendum 11/02/2021  1:17 PM by Mora Bellman, MD  ?   ?Nursing Staff Provider  ?Office Location  [facility] Dating  [place] in ED on 11/22  ?Language  English  Anatomy US  normal  ?Flu Vaccine  declined Genetic Screen  NIPS:   ?TDaP vaccine    Hgb A1C or  ?GTT Early : ?Third trimester : 65  ?Covid    LAB RESULTS   ?Rhogam   Blood Type   O+  ?Feeding Plan Formula Antibody  neg  ?Contraception  Rubella  non immune  ?Circumcision  RPR   neg  ?Pediatrician   HBsAg   neg  ?Support Person Shanon Brow HIV  negative  ?Prenatal Classes  Varicella immune  ?  GBS  (For PCN allergy, check sensitivities)   ?BTL Consent     ?VBAC Consent  Pap    ?  Hgb Electro    ?Pelvis Tested  CF   ?   SMA   ?     ? ?BMI >=40 ?[ ]  early 1h gtt -  ?[ ]  screen sleep apnea ?[ ]  anesthesia consult (early and late if BMI > 45) ?[ ]  u/s for dating [ ]   ?[ ]  nutritional goals ?[ ]  folic acid 1mg  ?[x ] bASA (>12  weeks) ?[ ]  consider nutrition consult ?[ ]  consider maternal EKG 1st trimester ?[ ]  Growth u/s 28 [ ] , 32 [ ] , 36 weeks [ ]  ?[ ]  NST/AFI weekly 34+ weeks (34[] ,35[] ,36[] , 37[] , 38[] , 39[] , 40[] ) ?[ ]  IOL by 41 weeks (scheduled, prn [] ) ?  ?  ?  ?  ? ?Preterm labor symptoms and general obstetric precautions including but not limited to vaginal bleeding, contractions, leaking of fluid and fetal movement were reviewed in detail with the patient. ?Please refer to After Visit Summary for other counseling recommendations.  ?She has an appt with MFM in a few weeks for  likely growth scan. Her cervix is closed today, although she is noted to be contreacting on the monitor. She denies feeling these. She continues to smoke and is encouraged to cut back ?An Aptima swab and GBS culture sent today due to her contraction pattern on the NST. ? ?No follow-ups on file. ? ?Imagene Riches, CNM  ?11/29/2021 3:51 PM   ? ?

## 2021-12-01 LAB — CERVICOVAGINAL ANCILLARY ONLY
Bacterial Vaginitis (gardnerella): POSITIVE — AB
Chlamydia: NEGATIVE
Comment: NEGATIVE
Comment: NEGATIVE
Comment: NEGATIVE
Comment: NORMAL
Neisseria Gonorrhea: NEGATIVE
Trichomonas: POSITIVE — AB

## 2021-12-03 ENCOUNTER — Other Ambulatory Visit: Payer: Self-pay | Admitting: Obstetrics

## 2021-12-03 ENCOUNTER — Encounter: Payer: Self-pay | Admitting: Obstetrics

## 2021-12-03 DIAGNOSIS — O0990 Supervision of high risk pregnancy, unspecified, unspecified trimester: Secondary | ICD-10-CM

## 2021-12-03 DIAGNOSIS — A599 Trichomoniasis, unspecified: Secondary | ICD-10-CM

## 2021-12-03 LAB — CULTURE, BETA STREP (GROUP B ONLY): Strep Gp B Culture: NEGATIVE

## 2021-12-03 MED ORDER — METRONIDAZOLE 500 MG PO TABS: 500.0000 mg | ORAL_TABLET | Freq: Two times a day (BID) | ORAL | 0 refills | Status: DC

## 2021-12-03 NOTE — Progress Notes (Signed)
Patient has tested positive for Trichomonas and BV at her 35 week visit. She does not have My Chart set up , so will need to contact her on by phone from the office ?Imagene Riches, CNM  ?12/03/2021 10:18 PM  ? ?

## 2021-12-05 ENCOUNTER — Telehealth: Payer: Self-pay

## 2021-12-05 NOTE — Telephone Encounter (Signed)
Secure Chat from Paula Compton, CNM Saturday 12/03/21 @10 :22pm ?Hi [name]- this gal has tested + for both Trich and BV at 35 weeks. I sent in a Rx for her, but she does not have MYChart set up . Ca you call her Monday and tell her to pick up the medication and get started? Please stress that she must stop having sex with any partner until after they have both been treated.  Thanks! ?

## 2021-12-05 NOTE — Telephone Encounter (Signed)
Patient aware. She states she can't take pills, she needs something in liquid form or another form of treatment. ?

## 2021-12-06 ENCOUNTER — Other Ambulatory Visit: Payer: Self-pay | Admitting: Advanced Practice Midwife

## 2021-12-06 DIAGNOSIS — N760 Acute vaginitis: Secondary | ICD-10-CM

## 2021-12-06 DIAGNOSIS — A599 Trichomoniasis, unspecified: Secondary | ICD-10-CM

## 2021-12-06 MED ORDER — METRONIDAZOLE 0.75 % VA GEL: 1.0000 | Freq: Every day | VAGINAL | 1 refills | Status: AC

## 2021-12-06 NOTE — Progress Notes (Signed)
Rx metro gel to treat trich and bv. Patient states she is unable to take pills in any form. Message to [facility] in case additional/different treatment needed. ?

## 2021-12-07 ENCOUNTER — Ambulatory Visit (INDEPENDENT_AMBULATORY_CARE_PROVIDER_SITE_OTHER): Payer: Medicaid Other | Admitting: Obstetrics and Gynecology

## 2021-12-07 ENCOUNTER — Other Ambulatory Visit: Payer: Medicaid Other

## 2021-12-07 ENCOUNTER — Encounter: Payer: Self-pay | Admitting: Obstetrics and Gynecology

## 2021-12-07 VITALS — BP 118/82 | Wt 232.0 lb

## 2021-12-07 DIAGNOSIS — O0990 Supervision of high risk pregnancy, unspecified, unspecified trimester: Secondary | ICD-10-CM

## 2021-12-07 DIAGNOSIS — O34219 Maternal care for unspecified type scar from previous cesarean delivery: Secondary | ICD-10-CM

## 2021-12-07 DIAGNOSIS — A599 Trichomoniasis, unspecified: Secondary | ICD-10-CM

## 2021-12-07 DIAGNOSIS — O99213 Obesity complicating pregnancy, third trimester: Secondary | ICD-10-CM

## 2021-12-07 LAB — FETAL NONSTRESS TEST

## 2021-12-07 NOTE — Addendum Note (Signed)
Addended by: Kathlene Cote on: 12/07/2021 10:20 AM ? ? Modules accepted: Orders ? ?

## 2021-12-07 NOTE — Progress Notes (Signed)
? ?  PRENATAL VISIT NOTE ? ?Subjective:  ?Cindy Rodriguez is a 26 y.o. G3P1011 at [redacted]w[redacted]d being seen today for ongoing prenatal care.  She is currently monitored for the following issues for this high-risk pregnancy and has Morbid obesity ([facility]); History of COVID-19; Tobacco use in pregnancy, antepartum; Marijuana use; +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 MJ; Trichomonal vaginitis in pregnancy in second trimester; Supervision of high risk pregnancy, antepartum; Trichomonas infection; Obesity affecting pregnancy; Round ligament pain; and Previous cesarean delivery affecting pregnancy on their problem list. ? ?Patient reports no complaints.  Contractions: Not present. Vag. Bleeding: None.  Movement: Present. Denies leaking of fluid.  ? ?The following portions of the patient's history were reviewed and updated as appropriate: allergies, current medications, past family history, past medical history, past social history, past surgical history and problem list.  ? ?Objective:  ? ?Vitals:  ? 12/07/21 0945  ?BP: 118/82  ?Weight: 232 lb (105.2 kg)  ? ? ?Fetal Status:     Movement: Present    ? ?General:  Alert, oriented and cooperative. Patient is in no acute distress.  ?Skin: Skin is warm and dry. No rash noted.   ?Cardiovascular: Normal heart rate noted  ?Respiratory: Normal respiratory effort, no problems with respiration noted  ?Abdomen: Soft, gravid, appropriate for gestational age.  Pain/Pressure: Absent     ?Pelvic: Cervical exam deferred        ?Extremities: Normal range of motion.     ?Mental Status: Normal mood and affect. Normal behavior. Normal judgment and thought content.  ? ?Assessment and Plan:  ?Pregnancy: G3P1011 at [redacted]w[redacted]d ?1. Supervision of high risk pregnancy, antepartum ?Patient is doing well without complaints ?Patient is undecided on contraception ? ?2. Obesity affecting pregnancy in third trimester ?Patient is not taking ASA ? ?3. Trichomonas infection ?Patient did not take flagyl as she could not tolerate it ?Rx metrogel has  been sent to her pharmacy ? ?4. Previous cesarean delivery affecting pregnancy ?Plans TOLAC ? ?Preterm labor symptoms and general obstetric precautions including but not limited to vaginal bleeding, contractions, leaking of fluid and fetal movement were reviewed in detail with the patient. ?Please refer to After Visit Summary for other counseling recommendations.  ? ?No follow-ups on file. ? ?Future Appointments  ?Date Time Provider Department Center  ?12/07/2021 10:35 AM Marliss Buttacavoli, Gigi Gin, MD [facility] None  ?12/15/2021 12:00 PM [facility] CON[facility] None  ?12/16/2021 12:45 PM [facility] NURSE [facility]  ?12/16/2021  1:00 PM [facility] US1 [facility]  ?12/16/2021  2:00 PM [facility] MD RM [facility]  ? ? ?Catalina Antigua, MD ? ?

## 2021-12-07 NOTE — Telephone Encounter (Signed)
Patient was seen in the office today. She is aware of metrogel rx sent in. Dr. Jolayne Panther discussed with patient during visit. ?

## 2021-12-15 ENCOUNTER — Ambulatory Visit (INDEPENDENT_AMBULATORY_CARE_PROVIDER_SITE_OTHER): Payer: Medicaid Other | Admitting: Obstetrics

## 2021-12-15 ENCOUNTER — Ambulatory Visit (INDEPENDENT_AMBULATORY_CARE_PROVIDER_SITE_OTHER): Payer: Medicaid Other

## 2021-12-15 ENCOUNTER — Encounter: Payer: Self-pay | Admitting: Obstetrics

## 2021-12-15 ENCOUNTER — Inpatient Hospital Stay: Admission: RE | Admit: 2021-12-15 | Payer: Medicaid Other | Source: Ambulatory Visit

## 2021-12-15 VITALS — BP 112/70 | Wt 238.0 lb

## 2021-12-15 DIAGNOSIS — Z3A36 36 weeks gestation of pregnancy: Secondary | ICD-10-CM

## 2021-12-15 DIAGNOSIS — O34219 Maternal care for unspecified type scar from previous cesarean delivery: Secondary | ICD-10-CM

## 2021-12-15 DIAGNOSIS — O23599 Infection of other part of genital tract in pregnancy, unspecified trimester: Secondary | ICD-10-CM

## 2021-12-15 DIAGNOSIS — O0990 Supervision of high risk pregnancy, unspecified, unspecified trimester: Secondary | ICD-10-CM

## 2021-12-15 DIAGNOSIS — O99019 Anemia complicating pregnancy, unspecified trimester: Secondary | ICD-10-CM

## 2021-12-15 DIAGNOSIS — O99330 Smoking (tobacco) complicating pregnancy, unspecified trimester: Secondary | ICD-10-CM

## 2021-12-15 DIAGNOSIS — Z23 Encounter for immunization: Secondary | ICD-10-CM

## 2021-12-15 DIAGNOSIS — O99213 Obesity complicating pregnancy, third trimester: Secondary | ICD-10-CM | POA: Diagnosis not present

## 2021-12-15 DIAGNOSIS — O99210 Obesity complicating pregnancy, unspecified trimester: Secondary | ICD-10-CM

## 2021-12-15 DIAGNOSIS — O09899 Supervision of other high risk pregnancies, unspecified trimester: Secondary | ICD-10-CM

## 2021-12-15 DIAGNOSIS — A5901 Trichomonal vulvovaginitis: Secondary | ICD-10-CM

## 2021-12-15 DIAGNOSIS — Z2839 Other underimmunization status: Secondary | ICD-10-CM

## 2021-12-15 LAB — POCT URINALYSIS DIPSTICK OB
Glucose, UA: NEGATIVE
POC,PROTEIN,UA: NEGATIVE

## 2021-12-15 LAB — FETAL NONSTRESS TEST

## 2021-12-15 NOTE — Patient Instructions (Signed)
Please call if you have elevated blood pressure, vision changes, headache, right-sided upper abdominal pain.  Please call if you have contractions occurring every 5-10 minutes for 1-2 hours, if you have vaginal bleeding, if watery fluid is leaking from your vagina, or if you have decreased fetal movement.  If you are not yet signed up on [facility] MyChart, please ask us how to sign up for it!  

## 2021-12-15 NOTE — Progress Notes (Signed)
Pt aware.

## 2021-12-15 NOTE — Progress Notes (Signed)
Subjective: Patient returns for pregnancy follow-up. She has no concerns. She is still taking her Flagyl for trichomonas. She has had no intercourse with that partner since. She reports good fetal movement, denies vaginal bleeding or leakage of fluid. She denies contractions. She denies urinary symptoms. Denies headaches, blurred vision or epigastric pain.  Objective: BP 112/70   Wt 238 lb (108 kg)   LMP 04/04/2021 (Exact Date)   BMI 44.97 kg/m  Physical Exam Vitals and nursing note reviewed.  Constitutional:      Appearance: Normal appearance. She is obese.  HENT:     Head: Normocephalic and atraumatic.  Eyes:     Extraocular Movements: Extraocular movements intact.  Pulmonary:     Effort: Pulmonary effort is normal.  Abdominal:     Palpations: Mass: gravid FH 38.  Musculoskeletal:        General: Normal range of motion.     Cervical back: Normal range of motion.  Skin:    General: Skin is warm and dry.  Neurological:     General: No focal deficit present.     Mental Status: She is alert and oriented to person, place, and time.  Psychiatric:        Mood and Affect: Mood normal.        Behavior: Behavior normal.        Thought Content: Thought content normal.   Non stress test FHTs 130s moderate variability positive accelerations no decelerations, reactive toco contractions Time >/= 20 min  Assessment and Plan:  Patient Cindy Rodriguez is an 26 y.o. year old G3P1011 at 57w3dwith EDC of Estimated Date of Delivery: 01/09/22 with Supervision of high risk pregnancy, antepartum - Plan: POC Urinalysis Dipstick OB  Obesity affecting pregnancy in third trimester - Plan: POC Urinalysis Dipstick OB  [redacted] weeks gestation of pregnancy - Plan: POC Urinalysis Dipstick OB  Trichomonal vaginitis during pregnancy, antepartum  Obesity affecting pregnancy, antepartum - Plan: Protein / creatinine ratio, urine  Previous cesarean delivery, antepartum  Desires VBAC (vaginal birth after  cesarean) trial  Rubella non-immune status, antepartum  Maternal anemia in pregnancy, antepartum  Labs:O pos VI  RNI MMR PP  Maternal anemia - need finger stick hgb next visit. Recommend iron.  Genetics: CFDNA low risk ; its a boy Immunizations: s/p tdap  Smoker down to one cigarette per day  Reports MJ cessation  Weekly NST AFI ordered Hx Previous CS for fetal intolerance IUGR < 3%ile. Plans TOLAC. Follows with MFM for growth [place]  Obesity in pregnancy BMI 42; TWG 13 lbs but 6 lb increase since last visit. Will check PC ratio - urine dark. Not currently on ASA 81. Weekly NST AFI. Growth [place] with MFM ASCUS need HPV result Rpt GBS and STI cultures next visit E coli UTI 11/22 - rpt neg.   Return in about 1 week (around 12/22/2021) for routine OB and cultures.  Return in about 1 week (around 12/22/2021) for routine OB and cultures.

## 2021-12-16 ENCOUNTER — Ambulatory Visit: Payer: Medicaid Other

## 2021-12-16 ENCOUNTER — Ambulatory Visit: Payer: Medicaid Other | Attending: Licensed Practical Nurse

## 2021-12-16 LAB — PROTEIN / CREATININE RATIO, URINE
Creatinine, Urine: 153.1 mg/dL
Protein, Ur: 24.5 mg/dL
Protein/Creat Ratio: 160 mg/g{creat} (ref 0–200)

## 2021-12-20 ENCOUNTER — Inpatient Hospital Stay: Admission: RE | Admit: 2021-12-20 | Payer: Medicaid Other | Source: Ambulatory Visit

## 2021-12-21 NOTE — Progress Notes (Signed)
Pt aware.

## 2021-12-23 ENCOUNTER — Encounter: Payer: Self-pay | Admitting: Family Medicine

## 2021-12-23 ENCOUNTER — Other Ambulatory Visit: Payer: Medicaid Other

## 2021-12-23 ENCOUNTER — Ambulatory Visit (INDEPENDENT_AMBULATORY_CARE_PROVIDER_SITE_OTHER): Payer: Medicaid Other | Admitting: Family Medicine

## 2021-12-23 VITALS — BP 120/70 | Wt 241.0 lb

## 2021-12-23 DIAGNOSIS — A5901 Trichomonal vulvovaginitis: Secondary | ICD-10-CM

## 2021-12-23 DIAGNOSIS — O0990 Supervision of high risk pregnancy, unspecified, unspecified trimester: Secondary | ICD-10-CM

## 2021-12-23 DIAGNOSIS — O23593 Infection of other part of genital tract in pregnancy, third trimester: Secondary | ICD-10-CM

## 2021-12-23 DIAGNOSIS — O99213 Obesity complicating pregnancy, third trimester: Secondary | ICD-10-CM

## 2021-12-23 DIAGNOSIS — Z3A37 37 weeks gestation of pregnancy: Secondary | ICD-10-CM

## 2021-12-23 DIAGNOSIS — O34219 Maternal care for unspecified type scar from previous cesarean delivery: Secondary | ICD-10-CM

## 2021-12-23 LAB — POCT URINALYSIS DIPSTICK OB
Glucose, UA: NEGATIVE
POC,PROTEIN,UA: NEGATIVE

## 2021-12-23 LAB — FETAL NONSTRESS TEST

## 2021-12-23 NOTE — Progress Notes (Signed)
PRENATAL VISIT NOTE  Subjective:  Cindy Rodriguez is a 26 y.o. G3P1011 at [redacted]w[redacted]d being seen today for ongoing prenatal care.  She is currently monitored for the following issues for this high-risk pregnancy and has Morbid obesity ([facility]); History of COVID-19; Tobacco use in pregnancy, antepartum; Marijuana use; +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 MJ; Trichomonal vaginitis during pregnancy in third trimester; Supervision of high risk pregnancy, antepartum; Obesity affecting pregnancy; Round ligament pain; Previous cesarean delivery affecting pregnancy; and Desires VBAC (vaginal birth after cesarean) trial on their problem list.  Patient reports no complaints.  Contractions: Not present. Vag. Bleeding: None.  Movement: Present. Denies leaking of fluid.   The following portions of the patient's history were reviewed and updated as appropriate: allergies, current medications, past family history, past medical history, past social history, past surgical history and problem list.   Objective:   Vitals:   12/23/21 0853  BP: 120/70  Weight: 241 lb (109.3 kg)    Fetal Status:     Movement: Present     General:  Alert, oriented and cooperative. Patient is in no acute distress.  Skin: Skin is warm and dry. No rash noted.   Cardiovascular: Normal heart rate noted  Respiratory: Normal respiratory effort, no problems with respiration noted  Abdomen: Soft, gravid, appropriate for gestational age.  Pain/Pressure: Absent     Pelvic: Cervical exam deferred        Extremities: Normal range of motion.     Mental Status: Normal mood and affect. Normal behavior. Normal judgment and thought content.   Assessment and Plan:  Pregnancy: G3P1011 at [redacted]w[redacted]d 1. Supervision of high risk pregnancy, antepartum GBS done at 34 weeks= negative Will need repeat at 38-39 weeks  - POC Urinalysis Dipstick OB  2. Obesity affecting pregnancy in third trimester TWG=16 lb (7.258 kg) which is near goal based on pregravid BMI Referred 5/9 for  anesthesia consult- patient has spoken with April Hawley multiple times and has NO showed x 2 for consult.  April gave the phone number today to help facilitate the patient having this consult.  Weekly NST/AFIT starting at 34 NST Reactive today  - POC Urinalysis Dipstick OB  3. [redacted] weeks gestation of pregnancy - POC Urinalysis Dipstick OB  4. Desires VBAC (vaginal birth after cesarean) trial  Patient with a history of c-section for Fetal intolerance of labor, IUGR 3rd%  We reviewed that a TOLAC is reasonable for those with history of one prior C-section.   Risk of uterine rupture at term is 0.78 percent with TOLAC and 0.22 percent with ERCD. 1 in 10 uterine ruptures will result in neonatal death or neurological injury. The benefits of a trial of labor after cesarean (TOLAC) resulting in a vaginal birth after cesarean (VBAC) include the following: shorter length of hospital stay and postpartum recovery (in most cases); fewer complications, such as postpartum fever, wound or uterine infection, thromboembolism (blood clots in the leg or lung), need for blood transfusion and fewer neonatal breathing problems. The risks of an attempted VBAC or TOLAC include the following: Risk of failed trial of labor after cesarean (TOLAC) without a vaginal birth after cesarean (VBAC) resulting in repeat cesarean delivery (RCD) in about 20 to 40 percent of women who attempt VBAC.  Risk of rupture of uterus resulting in an emergency cesarean delivery. The risk of uterine rupture may be related in part to the type of uterine incision made during the first cesarean delivery. A previous transverse uterine incision has the lowest risk of  rupture (0.2 to 1.5 percent risk). Vertical or T-shaped uterine incisions have a higher risk of uterine rupture (4 to 9 percent risk)The risk of fetal death is very low with both VBAC and elective repeat cesarean delivery (ERCD), but the likelihood of fetal death is higher with VBAC than  with ERCD. Maternal death is very rare with either type of delivery. The risks of an elective repeat cesarean delivery (ERCD) were reviewed with the patient including but not limited to: 08/998 risk of uterine rupture which could have serious consequences, bleeding which may require transfusion; infection which may require antibiotics; injury to bowel, bladder or other surrounding organs (bowel, bladder, ureters); injury to the fetus; need for additional procedures including hysterectomy in the event of a life-threatening hemorrhage; thromboembolic phenomenon; abnormal placentation; incisional problems; death and other postoperative or anesthesia complications.     These risks and benefits are summarized on the consent form, which was reviewed with the patient during the visit.  All her questions answered and she signed a consent indicating a preference for TOLAC. A copy of the consent was given to the patient.    5. Previous cesarean delivery affecting pregnancy Desires TOLAC  6. Trichomonal vaginitis during pregnancy in third trimester TOC at 38-39 weeks with GBS swab in case patient goes post dates Completed course on 5/25, is not having sex with suspected partner that gave her trich.    Preterm labor symptoms and general obstetric precautions including but not limited to vaginal bleeding, contractions, leaking of fluid and fetal movement were reviewed in detail with the patient. Please refer to After Visit Summary for other counseling recommendations.   No follow-ups on file.  Future Appointments  Date Time Provider Department Center  12/23/2021  9:35 AM Federico Flake, MD [facility] None    Federico Flake, MD

## 2021-12-30 ENCOUNTER — Other Ambulatory Visit: Payer: Medicaid Other

## 2021-12-30 ENCOUNTER — Other Ambulatory Visit (HOSPITAL_COMMUNITY)
Admission: RE | Admit: 2021-12-30 | Discharge: 2021-12-30 | Disposition: A | Discharge status: Home / Self Care | Payer: Medicaid Other | Source: Ambulatory Visit | Attending: Licensed Practical Nurse | Admitting: Licensed Practical Nurse

## 2021-12-30 ENCOUNTER — Ambulatory Visit (INDEPENDENT_AMBULATORY_CARE_PROVIDER_SITE_OTHER): Payer: Medicaid Other | Admitting: Licensed Practical Nurse

## 2021-12-30 ENCOUNTER — Encounter: Payer: Self-pay | Admitting: Licensed Practical Nurse

## 2021-12-30 VITALS — BP 106/66 | Wt 242.6 lb

## 2021-12-30 DIAGNOSIS — Z3685 Encounter for antenatal screening for Streptococcus B: Secondary | ICD-10-CM | POA: Diagnosis not present

## 2021-12-30 DIAGNOSIS — Z113 Encounter for screening for infections with a predominantly sexual mode of transmission: Secondary | ICD-10-CM | POA: Diagnosis present

## 2021-12-30 DIAGNOSIS — O0990 Supervision of high risk pregnancy, unspecified, unspecified trimester: Secondary | ICD-10-CM

## 2021-12-30 DIAGNOSIS — Z3A38 38 weeks gestation of pregnancy: Secondary | ICD-10-CM

## 2021-12-30 LAB — FETAL NONSTRESS TEST

## 2021-12-30 LAB — POCT URINALYSIS DIPSTICK OB
Glucose, UA: NEGATIVE
POC,PROTEIN,UA: NEGATIVE

## 2021-12-30 NOTE — Progress Notes (Signed)
Routine Prenatal Care Visit  Subjective  Cindy Rodriguez is a 26 y.o. G3P1011 at [redacted]w[redacted]d being seen today for ongoing prenatal care.  She is currently monitored for the following issues for this high-risk pregnancy and has Morbid obesity ([place]); History of COVID-19; Tobacco use in pregnancy, antepartum; Marijuana use; +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 MJ; Trichomonal vaginitis during pregnancy in third trimester; Supervision of high risk pregnancy, antepartum; Obesity affecting pregnancy; Round ligament pain; Previous cesarean delivery affecting pregnancy; and Desires VBAC (vaginal birth after cesarean) trial on their problem list.  ----------------------------------------------------------------------------------- Patient reports Doing ok.  Going through some things with her family (her daughter was taken to [place] by her father without her permission).  Handling this stress well.  Discuss PP plans, her children's godmother and her mother will be around to help.  The FOB will not be present.  -[place] on 5/14 1.[redacted]w[redacted]d Viable Singleton Intrauterine pregnancy previously established criteria. 2. Growth is 43 %ile.  AFI is 10.95 cm.  -reviewed plan to birth by 41 wks, pt desires VBAC, will discuss IOL at a future appointment.   Contractions: Not present. Vag. Bleeding: None.  Movement: Present. Leaking Fluid denies.  ----------------------------------------------------------------------------------- The following portions of the patient's history were reviewed and updated as appropriate: allergies, current medications, past family history, past medical history, past social history, past surgical history and problem list. Problem list updated.  Objective  Blood pressure 106/66, weight 242 lb 9.6 oz (110 kg), last menstrual period 04/04/2021. Pregravid weight 225 lb (102.1 kg) Total Weight Gain 17 lb 9.6 oz (7.983 kg) Urinalysis: Urine Protein Negative  Urine Glucose Negative  Fetal Status: Fetal Heart Rate (bpm): [id number]    Movement: Present  Presentation: Vertex NST: baseline 135, moderate variability, pos accel, neg decel  General:  Alert, oriented and cooperative. Patient is in no acute distress.  Skin: Skin is warm and dry. No rash noted.   Cardiovascular: Normal heart rate noted  Respiratory: Normal respiratory effort, no problems with respiration noted  Abdomen: Soft, gravid, appropriate for gestational age. Pain/Pressure: Absent     Pelvic:  Cervical exam performed Dilation: Fingertip Effacement (%): Thick Station: Ballotable  Extremities: Normal range of motion.  Edema: Trace  Mental Status: Normal mood and affect. Normal behavior. Normal judgment and thought content.   Assessment   26 y.o. G3P1011 at [redacted]w[redacted]d by  01/09/2022, by Last Menstrual Period presenting for routine prenatal visit  Plan   pregnancy Problems (from 07/06/21 to present)     Problem Noted Resolved   Previous cesarean delivery affecting pregnancy 12/07/2021 by Mora Bellman, MD No   Round ligament pain 09/08/2021 by Gae Dry, MD No   Obesity affecting pregnancy 08/12/2021 by Rod Can, CNM No   Supervision of high risk pregnancy, antepartum 07/06/2021 by Allen Derry, CNM No   Overview Addendum 12/15/2021  9:18 AM by Otila Kluver, LPN     Nursing Staff Provider  Office Location  [facility] Dating  [place] in ED on 11/22  Language  English  Anatomy US  normal  Flu Vaccine  declined Genetic Screen  NIPS:   TDaP vaccine   12/15/21 Hgb A1C or  GTT Early : Third trimester : 33  Covid    LAB RESULTS   Rhogam  N/A Blood Type   O+  Feeding Plan Formula Antibody  neg  Contraception  Rubella  non immune  Circumcision  RPR   neg  Pediatrician   HBsAg   neg  Support Person Shanon Brow HIV  negative  Prenatal  Classes  Varicella immune    GBS  (For PCN allergy, check sensitivities)   BTL Consent     VBAC Consent  Pap      Hgb Electro    Pelvis Tested  CF      SMA         BMI >=40 [ ]  early 1h gtt -  [ ]  screen sleep  apnea [ ]  anesthesia consult (early and late if BMI > 45) [ ]  u/s for dating [ ]   [ ]  nutritional goals [ ]  folic acid 1mg  [x ] bASA (>12 weeks) [ ]  consider nutrition consult [ ]  consider maternal EKG 1st trimester [ ]  Growth u/s 28 [ ] , 32 [ ] , 36 weeks [ ]  [ ]  NST/AFI weekly 34+ weeks (34[] ,35[] ,36[] , 37[] , 38[] , 39[] , 40[] ) [ ]  IOL by 41 weeks (scheduled, prn [] )      Trichomonal vaginitis during pregnancy in third trimester 05/08/2019 by Debera Lat, RN No   Overview Addendum 12/23/2021  9:31 AM by Caren Macadam, MD    Tested 35 wks. Treated with Flagyl [ ]  TOC       Trichomonas infection 08/10/2021 by Imagene Riches, CNM 12/23/2021 by Caren Macadam, MD        Term labor symptoms and general obstetric precautions including but not limited to vaginal bleeding, contractions, leaking of fluid and fetal movement were reviewed in detail with the patient. Please refer to After Visit Summary for other counseling recommendations.   Return in about 1 week (around 01/06/2022) for ROB, NST.  GBS and aptima for [facility] collected   Roberto Scales, [place], [facility]  12/30/21  1:35 PM

## 2022-01-01 LAB — STREP GP B NAA: Strep Gp B NAA: POSITIVE — AB

## 2022-01-02 LAB — CERVICOVAGINAL ANCILLARY ONLY
Chlamydia: NEGATIVE
Comment: NEGATIVE
Comment: NEGATIVE
Comment: NORMAL
Neisseria Gonorrhea: NEGATIVE
Trichomonas: POSITIVE — AB

## 2022-01-04 ENCOUNTER — Other Ambulatory Visit: Payer: Self-pay | Admitting: Licensed Practical Nurse

## 2022-01-04 DIAGNOSIS — O0993 Supervision of high risk pregnancy, unspecified, third trimester: Secondary | ICD-10-CM

## 2022-01-04 DIAGNOSIS — A5901 Trichomonal vulvovaginitis: Secondary | ICD-10-CM

## 2022-01-04 MED ORDER — METRONIDAZOLE 0.75 % VA GEL: 1.0000 | Freq: Every day | VAGINAL | 1 refills | Status: DC

## 2022-01-04 NOTE — Progress Notes (Signed)
TC to [place], States she has not had her Anesthesia consult.  Reviewed the importance of her keeping  this apt as it impacts her safety of giving birth at [facility]. Stanisha asked if she could have an apt tomorrow, this cnm Spoke with the Anesthesiologist on the unit, he is not sure how to schedule consults. Referral placed, will ask front desk to help arrange consult.  -reviewed GBS pos and rec antibiotics in labor -your swab for Trich was positive, pt reports she had IC prior to "being safe", she is certain her partner has bene with another person and has reinfected her.  She reports she started to have symptoms prior to her last apt. Requesting treatment, she is not able to swallow pills Script for Metrogel sent.  Pt has ROB on 6/16, will need to discuss IOL by 40 per guidelines.  Carie Caddy, CNM  Domingo Pulse, [facility]  01/04/22  7:29 PM

## 2022-01-05 ENCOUNTER — Observation Stay
Admission: EM | Admit: 2022-01-05 | Discharge: 2022-01-05 | Disposition: A | Discharge status: Home / Self Care | Payer: Medicaid Other | Attending: Obstetrics | Admitting: Obstetrics

## 2022-01-05 ENCOUNTER — Encounter: Payer: Self-pay | Admitting: Obstetrics and Gynecology

## 2022-01-05 ENCOUNTER — Other Ambulatory Visit: Payer: Self-pay

## 2022-01-05 ENCOUNTER — Observation Stay: Payer: Medicaid Other

## 2022-01-05 DIAGNOSIS — O479 False labor, unspecified: Secondary | ICD-10-CM | POA: Diagnosis present

## 2022-01-05 DIAGNOSIS — O98813 Other maternal infectious and parasitic diseases complicating pregnancy, third trimester: Secondary | ICD-10-CM | POA: Diagnosis not present

## 2022-01-05 DIAGNOSIS — O34219 Maternal care for unspecified type scar from previous cesarean delivery: Secondary | ICD-10-CM

## 2022-01-05 DIAGNOSIS — O26893 Other specified pregnancy related conditions, third trimester: Secondary | ICD-10-CM | POA: Diagnosis present

## 2022-01-05 DIAGNOSIS — A599 Trichomoniasis, unspecified: Secondary | ICD-10-CM | POA: Insufficient documentation

## 2022-01-05 DIAGNOSIS — O471 False labor at or after 37 completed weeks of gestation: Secondary | ICD-10-CM | POA: Insufficient documentation

## 2022-01-05 DIAGNOSIS — A5901 Trichomonal vulvovaginitis: Secondary | ICD-10-CM

## 2022-01-05 DIAGNOSIS — N949 Unspecified condition associated with female genital organs and menstrual cycle: Secondary | ICD-10-CM

## 2022-01-05 DIAGNOSIS — Z3A39 39 weeks gestation of pregnancy: Secondary | ICD-10-CM | POA: Diagnosis not present

## 2022-01-05 DIAGNOSIS — O0990 Supervision of high risk pregnancy, unspecified, unspecified trimester: Secondary | ICD-10-CM

## 2022-01-05 DIAGNOSIS — Z3A29 29 weeks gestation of pregnancy: Secondary | ICD-10-CM

## 2022-01-05 DIAGNOSIS — O23593 Infection of other part of genital tract in pregnancy, third trimester: Secondary | ICD-10-CM | POA: Diagnosis not present

## 2022-01-05 DIAGNOSIS — R109 Unspecified abdominal pain: Secondary | ICD-10-CM | POA: Insufficient documentation

## 2022-01-05 DIAGNOSIS — O99213 Obesity complicating pregnancy, third trimester: Secondary | ICD-10-CM

## 2022-01-05 LAB — URINE DRUG SCREEN, QUALITATIVE (ARMC ONLY)
Amphetamines, Ur Screen: NOT DETECTED
Barbiturates, Ur Screen: NOT DETECTED
Benzodiazepine, Ur Scrn: NOT DETECTED
Cannabinoid 50 Ng, Ur ~~LOC~~: POSITIVE — AB
Cocaine Metabolite,Ur ~~LOC~~: NOT DETECTED
MDMA (Ecstasy)Ur Screen: NOT DETECTED
Methadone Scn, Ur: NOT DETECTED
Opiate, Ur Screen: NOT DETECTED
Phencyclidine (PCP) Ur S: NOT DETECTED
Tricyclic, Ur Screen: NOT DETECTED

## 2022-01-05 MED ORDER — METRONIDAZOLE 500 MG PO TABS
2000.0000 mg | ORAL_TABLET | Freq: Once | ORAL | Status: AC
  Administered 2022-01-05: 2000 mg via ORAL
  Filled 2022-01-05: qty 4

## 2022-01-05 MED ORDER — METRONIDAZOLE 500 MG PO TABS: 500.0000 mg | ORAL_TABLET | Freq: Two times a day (BID) | ORAL | 0 refills | Status: DC

## 2022-01-05 MED ORDER — ACETAMINOPHEN 325 MG PO TABS: 650.0000 mg | ORAL_TABLET | ORAL | Status: DC | PRN

## 2022-01-05 NOTE — OB Triage Note (Signed)
Patient is a G3P1, [redacted]w[redacted]d presents with complaint of abdominal pain. Patient rates her pain a 10 out of 10 on a 0-10 pain scale. She states that the pain os worse laying down. Patient states that the pain started last night. She has not taken any tylenol. Patient denies any bleeding or LOF. Patient reports +FM. VSS. Monitors applied and assessing.

## 2022-01-05 NOTE — Consult Note (Signed)
I had the distinct pleasure of meeting [facility] today for an OB anesthesia precheck.  She has a history of morbid obesity with a BMI today of 46 and she is [redacted]w[redacted]d.  Patient reports no previous problems with anesthesia, no problems with her back and has a MP score of 3.  We discussed the risks of Obesity in Pregnancy including but not limited to:  Patients with a BMI > 40 are at increased risk for complications during pregnancy, such as hypertensive disorders of pregnancy, gestational diabetes, obstructive sleep apnea, thromboembolic disease, prolonged labor, operative vaginal delivery and need for cesarean delivery. There is an increased risk of airway complications, including inability to place a breathing tube, should an emergency cesarean delivery be required. There is an increased risk of aspiration, where stomach contents get into the lungs and can cause inflammation, infection and even respiratory failure. Epidural placement is more difficult in obesity, often requires multiple attempts, more often results in inadequate pain relief, more often requires replacement due to movement of the epidural catheter and more often results in accidental dural puncture and post dural puncture headache.  Patient voiced understanding acknowledging that we had discussed her pathway forward.

## 2022-01-05 NOTE — OB Triage Note (Signed)
LABOR & DELIVERY OB TRIAGE NOTE  SUBJECTIVE  HPI Cindy Rodriguez is a 26 y.o. G3P1011 at [redacted]w[redacted]d with a history of prior c/s who presents to Labor & Delivery for contractions. She reported that her contractions started around 0100 and became more painful and regular. She denies LOF or vaginal bleeding. She endorses good fetal movement. She did test positive for trichomoniasis. She vomited the metronidazole before being able to complete the course. Difficulty determining fetal presentation by Leopold's. Vertex was confirmed by [place]. Cervix remains FT after several hours of contractions.  OB History     Gravida  3   Para  1   Term  1   Preterm  0   AB  1   Living  1      SAB  1   IAB  0   Ectopic  0   Multiple  0   Live Births  1           Scheduled Meds: Continuous Infusions: PRN Meds:.acetaminophen  OBJECTIVE  BP 129/71 (BP Location: Right Arm)   Pulse 73   Temp 98.4 F (36.9 C) (Oral)   Resp 18   Ht 5\' 1"  (1.549 m)   Wt 111 kg   LMP 04/04/2021 (Exact Date)   BMI 46.24 kg/m   NST I reviewed the NST and it was reactive.  Baseline: 125 Variability: moderate Accelerations: present, 15x15 Decelerations:none Toco: irregular Category 1  ASSESSMENT Impression  1) Pregnancy at G3P1011, 100w3d, Estimated Date of Delivery: 01/09/22 2) Reassuring maternal/fetal status 3) No cervical change 4) BMI > 45 5) Trichomoniasis, inadequately treated  PLAN  1) Discharge home with standard labor/return precautions 2) Anesthesia consult for BMI completed while in triage 3) IOL scheduled for Sunday, 01/08/22 at 0500 4) Rx for metronidazole sent to pharmacy  01/10/22, CNM

## 2022-01-06 ENCOUNTER — Observation Stay
Admission: EM | Admit: 2022-01-06 | Discharge: 2022-01-06 | Disposition: A | Discharge status: Home / Self Care | Payer: Medicaid Other | Source: Home / Self Care | Admitting: Obstetrics

## 2022-01-06 ENCOUNTER — Other Ambulatory Visit: Payer: Self-pay

## 2022-01-06 ENCOUNTER — Encounter: Payer: Self-pay | Admitting: Obstetrics

## 2022-01-06 ENCOUNTER — Other Ambulatory Visit: Payer: Medicaid Other

## 2022-01-06 ENCOUNTER — Encounter: Payer: Medicaid Other | Admitting: Obstetrics

## 2022-01-06 DIAGNOSIS — O479 False labor, unspecified: Secondary | ICD-10-CM | POA: Diagnosis present

## 2022-01-06 DIAGNOSIS — Z3A39 39 weeks gestation of pregnancy: Secondary | ICD-10-CM | POA: Insufficient documentation

## 2022-01-06 DIAGNOSIS — Z79899 Other long term (current) drug therapy: Secondary | ICD-10-CM | POA: Insufficient documentation

## 2022-01-06 DIAGNOSIS — Z8616 Personal history of COVID-19: Secondary | ICD-10-CM | POA: Insufficient documentation

## 2022-01-06 DIAGNOSIS — O99213 Obesity complicating pregnancy, third trimester: Secondary | ICD-10-CM

## 2022-01-06 DIAGNOSIS — O471 False labor at or after 37 completed weeks of gestation: Secondary | ICD-10-CM | POA: Insufficient documentation

## 2022-01-06 DIAGNOSIS — F17210 Nicotine dependence, cigarettes, uncomplicated: Secondary | ICD-10-CM | POA: Insufficient documentation

## 2022-01-06 DIAGNOSIS — Z7982 Long term (current) use of aspirin: Secondary | ICD-10-CM | POA: Insufficient documentation

## 2022-01-06 DIAGNOSIS — Z98891 History of uterine scar from previous surgery: Secondary | ICD-10-CM | POA: Insufficient documentation

## 2022-01-06 DIAGNOSIS — N949 Unspecified condition associated with female genital organs and menstrual cycle: Secondary | ICD-10-CM

## 2022-01-06 DIAGNOSIS — O34219 Maternal care for unspecified type scar from previous cesarean delivery: Secondary | ICD-10-CM

## 2022-01-06 DIAGNOSIS — A5901 Trichomonal vulvovaginitis: Secondary | ICD-10-CM

## 2022-01-06 DIAGNOSIS — O0990 Supervision of high risk pregnancy, unspecified, unspecified trimester: Secondary | ICD-10-CM

## 2022-01-06 DIAGNOSIS — O99333 Smoking (tobacco) complicating pregnancy, third trimester: Secondary | ICD-10-CM | POA: Insufficient documentation

## 2022-01-06 LAB — URINALYSIS, ROUTINE W REFLEX MICROSCOPIC
Bacteria, UA: NONE SEEN
Bilirubin Urine: NEGATIVE
Glucose, UA: NEGATIVE mg/dL
Hgb urine dipstick: NEGATIVE
Ketones, ur: NEGATIVE mg/dL
Nitrite: NEGATIVE
Protein, ur: NEGATIVE mg/dL
Specific Gravity, Urine: 1.01 (ref 1.005–1.030)
pH: 7 (ref 5.0–8.0)

## 2022-01-06 NOTE — OB Triage Note (Signed)
Patient is a G3P1, [redacted]w[redacted]d that presents with complaints of ctx. Patient states that she was "up all night" with the pain. Patient denies LOF and Bleeding. Patient reports +FM. VSS. Monitors applied and assessing.

## 2022-01-06 NOTE — Discharge Summary (Signed)
Please see final Progress report. Mirna Mires, CNM  01/06/2022 12:58 PM

## 2022-01-06 NOTE — Progress Notes (Signed)
Patient given discharge instructions, patient verbalizes understanding. Patient discharged home by self, patient pushed in wheelchair down to hospital entrance by surgical tech.

## 2022-01-06 NOTE — Final Progress Note (Signed)
Final Progress Note  Patient ID: Cindy Rodriguez MRN: [id number] DOB/AGE: [birth year] 26 y.o.  Admit date: 01/06/2022 Admitting provider: Mirna Mires, CNM Discharge date: 01/06/2022   Admission Diagnoses: uterine contractions  Discharge Diagnoses:  Principal Problem:   Uterine contractions at greater than 20 weeks of gestation  Reactive fetal heart tones  History of Present Illness: The patient is a 26 y.o. female G3P1011 at [redacted]w[redacted]d who presents for a labor check. She was seen yesterday as well in triage and found to be a FT dilated, monitored and then discharged home. She returns today c/o Ucs q 3 minutes. She denies LOF and reports her baby is moving well. She has not yet eaten today, did not sleep well last night, and has been chewing ice, and not hydrating much..  Of noted, this patient has been provided treatment for Trich several times and has not picked up the medication. She has trouble swallowing pills, so has twice been prescribed Metronidazole gel- she admits that she has not picked up the medication yet, but did stop to pick up a snack on her way to the hospital today.  Past Medical History:  Diagnosis Date   Dyspnea 03/27/2019   + Covid-19 (03/23/2019)   Trichomonas infection 08/10/2021    Past Surgical History:  Procedure Laterality Date   CESAREAN SECTION N/A 11/01/2019   Procedure: CESAREAN SECTION;  Surgeon: Natale Milch, MD;  Location: [facility] ORS;  Service: Obstetrics;  Laterality: N/A;    No current facility-administered medications on file prior to encounter.   Current Outpatient Medications on File Prior to Encounter  Medication Sig Dispense Refill   aspirin EC 81 MG tablet Take 1 tablet (81 mg total) by mouth daily. Take after 12 weeks for prevention of preeclampsia later in pregnancy (Patient not taking: Reported on 11/18/2021) 300 tablet 2   metroNIDAZOLE (FLAGYL) 500 MG tablet Take 1 tablet (500 mg total) by mouth 2 (two) times daily. 14 tablet 0    metroNIDAZOLE (METROGEL) 0.75 % vaginal gel Place 1 Applicatorful vaginally at bedtime. Apply one applicatorful to vagina at bedtime for 5 days 70 g 1    Allergies  Allergen Reactions   Mango Butter Itching    Mangos    Social History   Socioeconomic History   Marital status: Single    Spouse name: Not on file   Number of children: Not on file   Years of education: Not on file   Highest education level: Not on file  Occupational History   Not on file  Tobacco Use   Smoking status: Every Day    Packs/day: 0.50    Years: 1.00    Total pack years: 0.50    Types: Cigarettes   Smokeless tobacco: Never   Tobacco comments:    She may smoke a cigarette once ina while, but rare  Vaping Use   Vaping Use: Never used  Substance and Sexual Activity   Alcohol use: Not Currently    Alcohol/week: 2.0 standard drinks of alcohol    Types: 1 Glasses of wine, 1 Shots of liquor per week    Comment: Last ETOH 1 month ago   Drug use: Yes    Types: Marijuana   Sexual activity: Yes  Other Topics Concern   Not on file  Social History Narrative   Not on file   Social Determinants of Health   Financial Resource Strain: Not on file  Food Insecurity: Food Insecurity Present (05/05/2019)   Hunger Vital Sign  Worried About Programme researcher, broadcasting/film/video in the Last Year: Sometimes true    Barista in the Last Year: Sometimes true  Transportation Needs: Not on file  Physical Activity: Not on file  Stress: Not on file  Social Connections: Not on file  Intimate Partner Violence: Not At Risk (05/25/2021)   Humiliation, Afraid, Rape, and Kick questionnaire    Fear of Current or Ex-Partner: No    Emotionally Abused: No    Physically Abused: No    Sexually Abused: No    Family History  Problem Relation Age of Onset   Hypertension Mother    Diabetes Mother    Asthma Mother    Asthma Sister    Thyroid disease Maternal Grandmother    Seizures Maternal Grandmother    Cancer Maternal  Grandmother    Asthma Maternal Grandmother    Drug abuse Maternal Grandfather    Cancer Maternal Grandfather    Sickle cell trait Maternal Grandfather    Hypertension Paternal Grandmother    Cirrhosis Paternal Grandmother    Hypertension Paternal Grandfather    Drug abuse Paternal Grandfather    Heart disease Paternal Grandfather    Cirrhosis Paternal Grandfather      Review of Systems  Constitutional: Negative.   HENT: Negative.    Eyes: Negative.   Respiratory: Negative.    Cardiovascular: Negative.   Gastrointestinal: Negative.   Genitourinary: Negative.   Musculoskeletal: Negative.        Reports having regular contractions  Skin: Negative.   Neurological: Negative.   Endo/Heme/Allergies: Negative.   Psychiatric/Behavioral: Negative.       Physical Exam: LMP 04/04/2021 (Exact Date)   Physical Exam Constitutional:      Appearance: Normal appearance. She is obese.  Genitourinary:     Vulva and rectum normal.     Genitourinary Comments: No disharge of visible LOF. SVE: tight 2 cms/70%/-3  Per toco, contracting every 8-15 minutes.  Cardiovascular:     Rate and Rhythm: Normal rate and regular rhythm.     Pulses: Normal pulses.     Heart sounds: Normal heart sounds.  Pulmonary:     Effort: Pulmonary effort is normal.     Breath sounds: Normal breath sounds.  Abdominal:     Comments: Gravidm, high BMI  Musculoskeletal:     Cervical back: Normal range of motion and neck supple.  Neurological:     General: No focal deficit present.     Mental Status: She is oriented to person, place, and time.  Skin:    General: Skin is warm and dry.  Psychiatric:        Mood and Affect: Mood normal.        Behavior: Behavior normal.  Vitals reviewed.     Consults: None  Significant Findings/ Diagnostic Studies: labs:  Results for orders placed or performed during the hospital encounter of 01/06/22 (from the past 24 hour(s))  Urinalysis, Routine w reflex microscopic      Status: Abnormal   Collection Time: 01/06/22 10:51 AM  Result Value Ref Range   Color, Urine YELLOW (A) YELLOW   APPearance HAZY (A) CLEAR   Specific Gravity, Urine 1.010 1.005 - 1.030   pH 7.0 5.0 - 8.0   Glucose, UA NEGATIVE NEGATIVE mg/dL   Hgb urine dipstick NEGATIVE NEGATIVE   Bilirubin Urine NEGATIVE NEGATIVE   Ketones, ur NEGATIVE NEGATIVE mg/dL   Protein, ur NEGATIVE NEGATIVE mg/dL   Nitrite NEGATIVE NEGATIVE   Leukocytes,Ua MODERATE (A) NEGATIVE  RBC / HPF 0-5 0 - 5 RBC/hpf   WBC, UA 6-10 0 - 5 WBC/hpf   Bacteria, UA NONE SEEN NONE SEEN   Squamous Epithelial / LPF 11-20 0 - 5   Mucus PRESENT      Procedures: EFM NST Baseline FHR: 135 beats/min Variability: moderate Accelerations: present Decelerations: absent Tocometry: two contractions noted 8 minutes apart. After hydrating and eating a snack, little contraction activity noted per toco.  Interpretation:  INDICATIONS: rule out uterine contractions RESULTS:  A NST procedure was performed with FHR monitoring and a normal baseline established, appropriate time of 20-40 minutes of evaluation, and accels >2 seen w 15x15 characteristics.  Results show a REACTIVE NST.    Hospital Course: The patient was admitted to Labor and Delivery Triage for observation. She provided a urine sample and was then placed on the FM. A reactive tracing was noted , with overall little contraction activity. Her VS were WNL. Her cervical exam showed a tight 2 cms/70% effacement, and -3 station.  After monitoinr for 45 minutes, the patient was found to be soundly sleeping. With reassuring FHTs and no labor  of any active frequency, she is discharged home. She is strongly advised to pick up the flagyl and treat for trichomonas prior to her scheduled IOL two days hence.  Discharge Condition: good  Disposition: Discharge disposition: 01-Home or Self Care       Diet: Regular diet  Discharge Activity: Activity as tolerated  Discharge  Instructions     Fetal Kick Count:  Lie on our left side for one hour after a meal, and count the number of times your baby kicks.  If it is less than 5 times, get up, move around and drink some juice.  Repeat the test 30 minutes later.  If it is still less than 5 kicks in an hour, notify your doctor.   Complete by: As directed    LABOR:  When conractions begin, you should start to time them from the beginning of one contraction to the beginning  of the next.  When contractions are 5 - 10 minutes apart or less and have been regular for at least an hour, you should call your health care provider.   Complete by: As directed    Notify physician for bleeding from the vagina   Complete by: As directed    Notify physician for blurring of vision or spots before the eyes   Complete by: As directed    Notify physician for chills or fever   Complete by: As directed    Notify physician for fainting spells, "black outs" or loss of consciousness   Complete by: As directed    Notify physician for increase in vaginal discharge   Complete by: As directed    Notify physician for leaking of fluid   Complete by: As directed    Notify physician for pain or burning when urinating   Complete by: As directed    Notify physician for pelvic pressure (sudden increase)   Complete by: As directed    Notify physician for severe or continued nausea or vomiting   Complete by: As directed    Notify physician for sudden gushing of fluid from the vagina (with or without continued leaking)   Complete by: As directed    Notify physician for sudden, constant, or occasional abdominal pain   Complete by: As directed    Notify physician if baby moving less than usual   Complete by: As directed  Allergies as of 01/06/2022       Reactions   Mango Butter Itching   Mangos        Medication List     TAKE these medications    aspirin EC 81 MG tablet Take 1 tablet (81 mg total) by mouth daily. Take after 12  weeks for prevention of preeclampsia later in pregnancy   metroNIDAZOLE 0.75 % vaginal gel Commonly known as: METROGEL Place 1 Applicatorful vaginally at bedtime. Apply one applicatorful to vagina at bedtime for 5 days   metroNIDAZOLE 500 MG tablet Commonly known as: FLAGYL Take 1 tablet (500 mg total) by mouth 2 (two) times daily.         Total time spent taking care of this patient: 35 minutes in direct care, review of records , ordering labs and discharge instructions.  Signed: Mirna Mires, CNM  01/06/2022, 2:09 PM

## 2022-01-07 ENCOUNTER — Observation Stay: Payer: Medicaid Other | Admitting: Anesthesiology

## 2022-01-07 ENCOUNTER — Inpatient Hospital Stay
Admission: EM | Admit: 2022-01-07 | Discharge: 2022-01-09 | DRG: 806 | Disposition: A | Discharge status: Home / Self Care | Payer: Medicaid Other | Attending: Obstetrics and Gynecology | Admitting: Obstetrics and Gynecology

## 2022-01-07 ENCOUNTER — Encounter: Payer: Self-pay | Admitting: Obstetrics and Gynecology

## 2022-01-07 DIAGNOSIS — O9882 Other maternal infectious and parasitic diseases complicating childbirth: Secondary | ICD-10-CM

## 2022-01-07 DIAGNOSIS — A5901 Trichomonal vulvovaginitis: Secondary | ICD-10-CM | POA: Diagnosis present

## 2022-01-07 DIAGNOSIS — F17210 Nicotine dependence, cigarettes, uncomplicated: Secondary | ICD-10-CM | POA: Diagnosis present

## 2022-01-07 DIAGNOSIS — O9832 Other infections with a predominantly sexual mode of transmission complicating childbirth: Secondary | ICD-10-CM | POA: Diagnosis present

## 2022-01-07 DIAGNOSIS — O99324 Drug use complicating childbirth: Secondary | ICD-10-CM

## 2022-01-07 DIAGNOSIS — O34219 Maternal care for unspecified type scar from previous cesarean delivery: Secondary | ICD-10-CM | POA: Diagnosis present

## 2022-01-07 DIAGNOSIS — O99214 Obesity complicating childbirth: Principal | ICD-10-CM | POA: Diagnosis present

## 2022-01-07 DIAGNOSIS — O99334 Smoking (tobacco) complicating childbirth: Secondary | ICD-10-CM | POA: Diagnosis present

## 2022-01-07 DIAGNOSIS — O26893 Other specified pregnancy related conditions, third trimester: Secondary | ICD-10-CM | POA: Diagnosis present

## 2022-01-07 DIAGNOSIS — O479 False labor, unspecified: Secondary | ICD-10-CM | POA: Diagnosis present

## 2022-01-07 DIAGNOSIS — Z3490 Encounter for supervision of normal pregnancy, unspecified, unspecified trimester: Secondary | ICD-10-CM

## 2022-01-07 DIAGNOSIS — F1290 Cannabis use, unspecified, uncomplicated: Secondary | ICD-10-CM | POA: Diagnosis present

## 2022-01-07 DIAGNOSIS — O99824 Streptococcus B carrier state complicating childbirth: Secondary | ICD-10-CM | POA: Diagnosis present

## 2022-01-07 DIAGNOSIS — F1210 Cannabis abuse, uncomplicated: Secondary | ICD-10-CM

## 2022-01-07 DIAGNOSIS — O99820 Streptococcus B carrier state complicating pregnancy: Secondary | ICD-10-CM

## 2022-01-07 DIAGNOSIS — Z8616 Personal history of COVID-19: Secondary | ICD-10-CM

## 2022-01-07 DIAGNOSIS — Z3A39 39 weeks gestation of pregnancy: Secondary | ICD-10-CM

## 2022-01-07 DIAGNOSIS — O99320 Drug use complicating pregnancy, unspecified trimester: Secondary | ICD-10-CM | POA: Diagnosis present

## 2022-01-07 LAB — URINE DRUG SCREEN, QUALITATIVE (ARMC ONLY)
Amphetamines, Ur Screen: NOT DETECTED
Barbiturates, Ur Screen: NOT DETECTED
Benzodiazepine, Ur Scrn: NOT DETECTED
Cannabinoid 50 Ng, Ur ~~LOC~~: POSITIVE — AB
Cocaine Metabolite,Ur ~~LOC~~: NOT DETECTED
MDMA (Ecstasy)Ur Screen: NOT DETECTED
Methadone Scn, Ur: NOT DETECTED
Opiate, Ur Screen: NOT DETECTED
Phencyclidine (PCP) Ur S: NOT DETECTED
Tricyclic, Ur Screen: NOT DETECTED

## 2022-01-07 LAB — WET PREP, GENITAL
Clue Cells Wet Prep HPF POC: NONE SEEN
Sperm: NONE SEEN
Trich, Wet Prep: NONE SEEN
WBC, Wet Prep HPF POC: >=10 — AB (ref <10)
Yeast Wet Prep HPF POC: NONE SEEN

## 2022-01-07 LAB — CBC
HCT: 32.5 % — ABNORMAL LOW (ref 36.0–46.0)
Hemoglobin: 10.8 g/dL — ABNORMAL LOW (ref 12.0–15.0)
MCH: 27.2 pg (ref 26.0–34.0)
MCHC: 33.2 g/dL (ref 30.0–36.0)
MCV: 81.9 fL (ref 80.0–100.0)
Platelets: 216 10*3/uL (ref 150–400)
RBC: 3.97 MIL/uL (ref 3.87–5.11)
RDW: 15 % (ref 11.5–15.5)
WBC: 13 10*3/uL — ABNORMAL HIGH (ref 4.0–10.5)
nRBC: 0 % (ref 0.0–0.2)

## 2022-01-07 LAB — RPR: RPR Ser Ql: NONREACTIVE

## 2022-01-07 LAB — TYPE AND SCREEN
ABO/RH(D): O POS
Antibody Screen: NEGATIVE

## 2022-01-07 MED ORDER — FENTANYL-BUPIVACAINE-NACL 0.5-0.125-0.9 MG/250ML-% EP SOLN
12.0000 mL/h | EPIDURAL | Status: DC | PRN
  Administered 2022-01-07: 12 mL/h via EPIDURAL

## 2022-01-07 MED ORDER — SENNOSIDES-DOCUSATE SODIUM 8.6-50 MG PO TABS
2.0000 | ORAL_TABLET | Freq: Every day | ORAL | Status: DC
Start: 2022-01-08 — End: 2022-01-09
  Administered 2022-01-08 – 2022-01-09 (×2): 2 via ORAL
  Filled 2022-01-07 (×2): qty 2

## 2022-01-07 MED ORDER — WITCH HAZEL-GLYCERIN EX PADS: 1.0000 | MEDICATED_PAD | CUTANEOUS | Status: DC | PRN

## 2022-01-07 MED ORDER — TERBUTALINE SULFATE 1 MG/ML IJ SOLN: 0.2500 mg | Freq: Once | INTRAMUSCULAR | Status: DC | PRN

## 2022-01-07 MED ORDER — ACETAMINOPHEN 325 MG PO TABS
650.0000 mg | ORAL_TABLET | ORAL | Status: DC | PRN
  Filled 2022-01-07: qty 2

## 2022-01-07 MED ORDER — OXYCODONE-ACETAMINOPHEN 5-325 MG PO TABS: 2.0000 | ORAL_TABLET | ORAL | Status: DC | PRN

## 2022-01-07 MED ORDER — EPHEDRINE 5 MG/ML INJ: 10.0000 mg | INTRAVENOUS | Status: DC | PRN

## 2022-01-07 MED ORDER — PHENYLEPHRINE 80 MCG/ML (10ML) SYRINGE FOR IV PUSH (FOR BLOOD PRESSURE SUPPORT): 80.0000 ug | PREFILLED_SYRINGE | INTRAVENOUS | Status: DC | PRN

## 2022-01-07 MED ORDER — TETANUS-DIPHTH-ACELL PERTUSSIS 5-2.5-18.5 LF-MCG/0.5 IM SUSY
0.5000 mL | PREFILLED_SYRINGE | Freq: Once | INTRAMUSCULAR | Status: DC
  Filled 2022-01-07: qty 0.5

## 2022-01-07 MED ORDER — AMMONIA AROMATIC IN INHA
RESPIRATORY_TRACT | Status: AC
  Filled 2022-01-07: qty 10

## 2022-01-07 MED ORDER — OXYTOCIN BOLUS FROM INFUSION
333.0000 mL | Freq: Once | INTRAVENOUS | Status: AC
  Administered 2022-01-07: 333 mL via INTRAVENOUS

## 2022-01-07 MED ORDER — LIDOCAINE HCL (PF) 1 % IJ SOLN
INTRAMUSCULAR | Status: AC
  Filled 2022-01-07: qty 30

## 2022-01-07 MED ORDER — PENICILLIN G POTASSIUM 5000000 UNITS IJ SOLR
5.0000 10*6.[IU] | Freq: Once | INTRAMUSCULAR | Status: AC
  Administered 2022-01-07: 5 10*6.[IU] via INTRAVENOUS

## 2022-01-07 MED ORDER — OXYTOCIN-SODIUM CHLORIDE 30-0.9 UT/500ML-% IV SOLN
1.0000 m[IU]/min | INTRAVENOUS | Status: DC
  Administered 2022-01-07: 2 m[IU]/min via INTRAVENOUS

## 2022-01-07 MED ORDER — OXYCODONE-ACETAMINOPHEN 5-325 MG PO TABS: 1.0000 | ORAL_TABLET | ORAL | Status: DC | PRN

## 2022-01-07 MED ORDER — LACTATED RINGERS IV SOLN
500.0000 mL | Freq: Once | INTRAVENOUS | Status: AC
Start: 2022-01-07 — End: 2022-01-07
  Administered 2022-01-07: 500 mL via INTRAVENOUS

## 2022-01-07 MED ORDER — PENICILLIN G POTASSIUM 5000000 UNITS IJ SOLR
3.0000 10*6.[IU] | INTRAMUSCULAR | Status: DC
Start: 2022-01-07 — End: 2022-01-07

## 2022-01-07 MED ORDER — PENICILLIN G POT IN DEXTROSE 60000 UNIT/ML IV SOLN
3.0000 10*6.[IU] | INTRAVENOUS | Status: DC
  Administered 2022-01-07: 3 10*6.[IU] via INTRAVENOUS
  Filled 2022-01-07: qty 50

## 2022-01-07 MED ORDER — DIBUCAINE (PERIANAL) 1 % EX OINT: 1.0000 | TOPICAL_OINTMENT | CUTANEOUS | Status: DC | PRN

## 2022-01-07 MED ORDER — SODIUM CHLORIDE 0.9 % IV SOLN
INTRAVENOUS | Status: DC | PRN
  Administered 2022-01-07 (×2): 5 mL via EPIDURAL

## 2022-01-07 MED ORDER — LIDOCAINE-EPINEPHRINE (PF) 1.5 %-1:200000 IJ SOLN
INTRAMUSCULAR | Status: DC | PRN
  Administered 2022-01-07: 3 mL via EPIDURAL

## 2022-01-07 MED ORDER — PRENATAL MULTIVITAMIN CH
1.0000 | ORAL_TABLET | Freq: Every day | ORAL | Status: DC
Start: 2022-01-08 — End: 2022-01-09
  Filled 2022-01-07: qty 1

## 2022-01-07 MED ORDER — MISOPROSTOL 200 MCG PO TABS
ORAL_TABLET | ORAL | Status: AC
  Filled 2022-01-07: qty 4

## 2022-01-07 MED ORDER — ONDANSETRON HCL 4 MG/2ML IJ SOLN: 4.0000 mg | INTRAMUSCULAR | Status: DC | PRN

## 2022-01-07 MED ORDER — FENTANYL-BUPIVACAINE-NACL 0.5-0.125-0.9 MG/250ML-% EP SOLN
EPIDURAL | Status: AC
  Filled 2022-01-07: qty 250

## 2022-01-07 MED ORDER — SOD CITRATE-CITRIC ACID 500-334 MG/5ML PO SOLN: 30.0000 mL | ORAL | Status: DC | PRN

## 2022-01-07 MED ORDER — BENZOCAINE-MENTHOL 20-0.5 % EX AERO: 1.0000 | INHALATION_SPRAY | CUTANEOUS | Status: DC | PRN

## 2022-01-07 MED ORDER — MEDROXYPROGESTERONE ACETATE 150 MG/ML IM SUSP
150.0000 mg | INTRAMUSCULAR | Status: AC | PRN
  Administered 2022-01-09: 150 mg via INTRAMUSCULAR
  Filled 2022-01-07: qty 1

## 2022-01-07 MED ORDER — ACETAMINOPHEN 325 MG PO TABS
650.0000 mg | ORAL_TABLET | ORAL | Status: DC | PRN
  Administered 2022-01-07: 650 mg via ORAL

## 2022-01-07 MED ORDER — COCONUT OIL OIL: 1.0000 | TOPICAL_OIL | Status: DC | PRN

## 2022-01-07 MED ORDER — OXYTOCIN-SODIUM CHLORIDE 30-0.9 UT/500ML-% IV SOLN
2.5000 [IU]/h | INTRAVENOUS | Status: DC
  Filled 2022-01-07: qty 500

## 2022-01-07 MED ORDER — IBUPROFEN 100 MG/5ML PO SUSP
600.0000 mg | Freq: Four times a day (QID) | ORAL | Status: DC
  Administered 2022-01-07 – 2022-01-09 (×6): 600 mg via ORAL
  Filled 2022-01-07 (×6): qty 30

## 2022-01-07 MED ORDER — ACETAMINOPHEN 325 MG PO TABS: 650.0000 mg | ORAL_TABLET | Freq: Four times a day (QID) | ORAL | Status: DC | PRN

## 2022-01-07 MED ORDER — ONDANSETRON HCL 4 MG PO TABS: 4.0000 mg | ORAL_TABLET | ORAL | Status: DC | PRN

## 2022-01-07 MED ORDER — LIDOCAINE HCL (PF) 1 % IJ SOLN: 30.0000 mL | INTRAMUSCULAR | Status: DC | PRN

## 2022-01-07 MED ORDER — DIPHENHYDRAMINE HCL 25 MG PO CAPS: 25.0000 mg | ORAL_CAPSULE | Freq: Four times a day (QID) | ORAL | Status: DC | PRN

## 2022-01-07 MED ORDER — SODIUM CHLORIDE 0.9 % IV SOLN
INTRAVENOUS | Status: AC
  Filled 2022-01-07: qty 5

## 2022-01-07 MED ORDER — METRONIDAZOLE 500 MG/100ML IV SOLN
500.0000 mg | Freq: Three times a day (TID) | INTRAVENOUS | Status: DC
  Administered 2022-01-07 (×2): 500 mg via INTRAVENOUS
  Filled 2022-01-07 (×2): qty 100

## 2022-01-07 MED ORDER — DIPHENHYDRAMINE HCL 50 MG/ML IJ SOLN: 12.5000 mg | INTRAMUSCULAR | Status: DC | PRN

## 2022-01-07 MED ORDER — SIMETHICONE 80 MG PO CHEW: 80.0000 mg | CHEWABLE_TABLET | ORAL | Status: DC | PRN

## 2022-01-07 MED ORDER — LACTATED RINGERS IV SOLN
500.0000 mL | INTRAVENOUS | Status: DC | PRN
  Administered 2022-01-07: 500 mL via INTRAVENOUS

## 2022-01-07 MED ORDER — BUTORPHANOL TARTRATE 2 MG/ML IJ SOLN
1.0000 mg | INTRAMUSCULAR | Status: DC | PRN
  Administered 2022-01-07: 1 mg via INTRAVENOUS
  Filled 2022-01-07: qty 1

## 2022-01-07 MED ORDER — LIDOCAINE HCL (PF) 1 % IJ SOLN
INTRAMUSCULAR | Status: DC | PRN
  Administered 2022-01-07: 5 mL via SUBCUTANEOUS

## 2022-01-07 MED ORDER — ONDANSETRON HCL 4 MG/2ML IJ SOLN
4.0000 mg | Freq: Four times a day (QID) | INTRAMUSCULAR | Status: DC | PRN
  Administered 2022-01-07: 4 mg via INTRAVENOUS
  Filled 2022-01-07: qty 2

## 2022-01-07 MED ORDER — OXYTOCIN 10 UNIT/ML IJ SOLN
INTRAMUSCULAR | Status: AC
  Filled 2022-01-07: qty 2

## 2022-01-07 MED ORDER — IBUPROFEN 600 MG PO TABS
600.0000 mg | ORAL_TABLET | Freq: Four times a day (QID) | ORAL | Status: DC
  Filled 2022-01-07: qty 1

## 2022-01-07 MED ORDER — LACTATED RINGERS IV SOLN: INTRAVENOUS | Status: DC

## 2022-01-07 NOTE — H&P (Addendum)
OB History & Physical   History of Present Illness:  Chief Complaint: contractions  HPI:  Cindy Rodriguez is a 26 y.o. G65P1011 female at [redacted]w[redacted]d dated by LMP c/w 10w u/s.  Her pregnancy has been complicated by high-risk pregnancy and has Morbid obesity (HCC); History of COVID-19; Tobacco use in pregnancy, antepartum; Marijuana use; +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 MJ; Trichomonal vaginitis during pregnancy in third trimester untreated; Round ligament pain; Previous cesarean delivery affecting pregnancy; and Desires VBAC.    She reports contractions beginning yesterday.  She reports leakage of fluid at 8:25 this am.   She reports vaginal bleeding- bloody show.   She reports fetal movement.    Total weight gain for pregnancy: 8.941 kg   Obstetrical Problem List: pregnancy Problems (from 07/06/21 to present)     Problem Noted Resolved   Previous cesarean delivery affecting pregnancy 12/07/2021 by Catalina Antigua, MD No   Round ligament pain 09/08/2021 by Nadara Mustard, MD No   Obesity affecting pregnancy 08/12/2021 by Tresea Mall, CNM No   Supervision of high risk pregnancy, antepartum 07/06/2021 by Ellwood Sayers, CNM No   Overview Addendum 12/15/2021  9:18 AM by Rocco Serene, LPN     Nursing Staff Provider  Office Location  [facility] Dating  [place] in ED on 11/22  Language  English  Anatomy US  normal  Flu Vaccine  declined Genetic Screen  NIPS:   TDaP vaccine   12/15/21 Hgb A1C or  GTT Early : Third trimester : 35  Covid    LAB RESULTS   Rhogam  N/A Blood Type   O+  Feeding Plan Formula Antibody  neg  Contraception  Rubella  non immune  Circumcision  RPR   neg  Pediatrician   HBsAg   neg  Support Person David HIV  negative  Prenatal Classes  Varicella immune    GBS  (For PCN allergy, check sensitivities)   BTL Consent     VBAC Consent  Pap      Hgb Electro    Pelvis Tested  CF      SMA        BMI >=40 [ ]  early 1h gtt -  [ ]  screen sleep apnea [ ]  anesthesia consult (early and late if  BMI > 45) [ ]  u/s for dating [ ]   [ ]  nutritional goals [ ]  folic acid 1mg  [x ] bASA (>12 weeks) [ ]  consider nutrition consult [ ]  consider maternal EKG 1st trimester [ ]  Growth u/s 28 [ ] , 32 [ ] , 36 weeks [ ]  [ ]  NST/AFI weekly 34+ weeks (34[] ,35[] ,36[] , 37[] , 38[] , 39[] , 40[] ) [ ]  IOL by 41 weeks (scheduled, prn [] )      Trichomonal vaginitis during pregnancy in third trimester 05/08/2019 by , RN No   Overview Addendum 01/06/2022 12:28 PM by , CNM    Tested 35 wks.  [ ]  TOC has not self treated despite two prescriptions. As of 6/16, not treated (does not swallow pills) given Metronidazole gel.      Trichomonas infection 08/10/2021 by , CNM 12/23/2021 by , MD        Maternal Medical History:   Past Medical History:  Diagnosis Date   Dyspnea 03/27/2019   + Covid-19 (03/23/2019)   Trichomonas infection 08/10/2021    Past Surgical History:  Procedure Laterality Date   CESAREAN SECTION N/A 11/01/2019   Procedure: CESAREAN SECTION;  Surgeon: , MD;  Location: [facility]  ORS;  Service: Obstetrics;  Laterality: N/A;    Allergies  Allergen Reactions   Mango Butter Itching    Mangos    Prior to Admission medications   Medication Sig Start Date End Date Taking? Authorizing Provider  aspirin EC 81 MG tablet Take 1 tablet (81 mg total) by mouth daily. Take after 12 weeks for prevention of preeclampsia later in pregnancy Patient not taking: Reported on 11/18/2021 11/02/21   Constant, Peggy, MD  metroNIDAZOLE (FLAGYL) 500 MG tablet Take 1 tablet (500 mg total) by mouth 2 (two) times daily. 01/05/22   Glenetta Borg, CNM  metroNIDAZOLE (METROGEL) 0.75 % vaginal gel Place 1 Applicatorful vaginally at bedtime. Apply one applicatorful to vagina at bedtime for 5 days 01/04/22   Dominic, Courtney Heys, CNM    OB History  Gravida Para Term Preterm AB Living  3 1 1  0 1 1  SAB IAB Ectopic Multiple Live  Births  1 0 0 0 1    # Outcome Date GA Lbr Len/2nd Weight Sex Delivery Anes PTL Lv  3 Current           2 Term 11/01/19 [redacted]w[redacted]d  2170 g F CS-LTranv Spinal  LIV  1 SAB             Prenatal care site: [facility]  Social History: She  reports that she has been smoking cigarettes. She has a 0.50 pack-year smoking history. She has never used smokeless tobacco. She reports that she does not currently use alcohol after a past usage of about 2.0 standard drinks of alcohol per week. She reports current drug use. Drug: Marijuana.  Family History: family history includes Asthma in her maternal grandmother, mother, and sister; Cancer in her maternal grandfather and maternal grandmother; Cirrhosis in her paternal grandfather and paternal grandmother; Diabetes in her mother; Drug abuse in her maternal grandfather and paternal grandfather; Heart disease in her paternal grandfather; Hypertension in her mother, paternal grandfather, and paternal grandmother; Seizures in her maternal grandmother; Sickle cell trait in her maternal grandfather; Thyroid disease in her maternal grandmother.   Review of Systems:  Review of Systems  Constitutional:  Negative for chills and fever.  HENT:  Negative for congestion, ear discharge, ear pain, hearing loss, sinus pain and sore throat.   Eyes:  Negative for blurred vision and double vision.  Respiratory:  Negative for cough, shortness of breath and wheezing.   Cardiovascular:  Negative for chest pain, palpitations and leg swelling.  Gastrointestinal:  Positive for abdominal pain. Negative for blood in stool, constipation, diarrhea, heartburn, melena, nausea and vomiting.  Genitourinary:  Negative for dysuria, flank pain, frequency, hematuria and urgency.  Musculoskeletal:  Negative for back pain, joint pain and myalgias.  Skin:  Negative for itching and rash.  Neurological:  Negative for dizziness, tingling, tremors, sensory change, speech change, focal weakness,  seizures, loss of consciousness, weakness and headaches.  Endo/Heme/Allergies:  Negative for environmental allergies. Does not bruise/bleed easily.  Psychiatric/Behavioral:  Negative for depression, hallucinations, memory loss, substance abuse and suicidal ideas. The patient is not nervous/anxious and does not have insomnia.      Physical Exam:  BP (!) 115/52 (BP Location: Right Arm)   Pulse 60   Temp 98.2 F (36.8 C) (Oral)   Resp 16   Ht 5\' 1"  (1.549 m)   Wt 111 kg   LMP 04/04/2021 (Exact Date)   BMI 46.24 kg/m   Constitutional: Well nourished, well developed female in no acute distress.  HEENT:  normal Skin: Warm and dry.  Cardiovascular: Regular rate and rhythm.   Extremity: trace edema Respiratory: Clear to auscultation bilateral. Normal respiratory effort Abdomen: FHT present Neuro: DTRs 2+, Cranial nerves grossly intact Psych: Alert and Oriented x3. No memory deficits. Normal mood and affect.   Pelvic exam: (female chaperone present) is not limited by body habitus EGBUS: within normal limits Vagina: within normal limits and with normal mucosa Cervix: 7-8/80/-1   Baseline FHR: 130 beats/min   Variability: moderate   Accelerations: present   Decelerations: variable following SROM Contractions: present frequency: every 3-4 Overall assessment: reassuring  Lab Results  Component Value Date   [place] NEGATIVE 06/14/2021    Assessment:  Cindy Rodriguez is a 26 y.o. G61P1011 female at [redacted]w[redacted]d with active labor, TOLAC.   Plan:  Admit to Labor & Delivery  CBC, T&S, Clrs, IVF GBS positive: penicillin prophylaxis Fetal well-being: currently category I Expectant management for vaginal delivery, start pitocin as needed Anesthesia/MD aware of TOLAC    Rod Can, CNM 01/07/2022 9:10 AM

## 2022-01-07 NOTE — Progress Notes (Signed)
  Labor Progress Note   26 y.o. [id number] @ [redacted]w[redacted]d , admitted for  Pregnancy, Labor Management.   Subjective:  Generally comfortable with epidural- some rectal pain  Objective:  BP 126/85   Pulse 76   Temp 98.1 F (36.7 C) (Oral)   Resp 16   Ht 5\' 1"  (1.549 m)   Wt 111 kg   LMP 04/04/2021 (Exact Date)   SpO2 97%   BMI 46.24 kg/m  Abd: gravid, ND, FHT present, mild tenderness on exam Extr: trace to 1+ bilateral pedal edema SVE: CERVIX: 9.5 cm dilated, 90 effaced, +1 station  EFM: FHR: 125 bpm, variability: moderate,  accelerations:  Present,  decelerations:  Absent Toco: Frequency: Every 2-4 minutes Labs: I have reviewed the patient's lab results.   Assessment & Plan:  06/04/2021 @ [redacted]w[redacted]d, admitted for  Pregnancy and Labor/Delivery Management  1. Pain management: epidural. 2. FWB: FHT category I.  3. ID: GBS positive: s/p 2nd dose of penicillin 4. Labor management: expectant management  All discussed with patient, see orders   [redacted]w[redacted]d, CNM [facility] 01/07/2022  1:39 PM

## 2022-01-07 NOTE — Progress Notes (Signed)
Patient insistent on going outside to "smoke".  Educated patient that this is a no smoking facility but that I cannot keep her on the unit. Encouraged patient to not spend more than 15 minutes outside as she was just admitted to mother/baby. Patients visitor staying with baby and patient escorted out in wheelchair by other visitor. IV removed.

## 2022-01-07 NOTE — Anesthesia Preprocedure Evaluation (Signed)
Anesthesia Evaluation  Patient identified by MRN, date of birth, ID band Patient awake    Reviewed: Allergy & Precautions, NPO status , Patient's Chart, lab work & pertinent test results  History of Anesthesia Complications Negative for: history of anesthetic complications  Airway Mallampati: III  TM Distance: >3 FB Neck ROM: Full    Dental  (+) Teeth Intact, Dental Advidsory Given   Pulmonary neg pulmonary ROS, neg shortness of breath, neg sleep apnea, neg COPD, neg recent URI, Current Smoker and Patient abstained from smoking., former smoker,    Pulmonary exam normal breath sounds clear to auscultation       Cardiovascular Exercise Tolerance: Good METS(-) hypertension(-) angina(-) CAD and (-) Past MI negative cardio ROS  (-) dysrhythmias  Rhythm:Regular Rate:Normal - Systolic murmurs    Neuro/Psych negative neurological ROS  negative psych ROS   GI/Hepatic GERD  ,(+)     (-) substance abuse  ,   Endo/Other  neg diabetesMorbid obesity  Renal/GU negative Renal ROS     Musculoskeletal   Abdominal   Peds  Hematology   Anesthesia Other Findings Past Medical History: 03/27/2019: Dyspnea     Comment:  + Covid-19 (03/23/2019)  Reproductive/Obstetrics (+) Pregnancy Poor prenatal care                             Anesthesia Physical  Anesthesia Plan  ASA: 3  Anesthesia Plan: Epidural   Post-op Pain Management:    Induction:   PONV Risk Score and Plan: 4 or greater and Ondansetron and Dexamethasone  Airway Management Planned: Natural Airway  Additional Equipment:   Intra-op Plan:   Post-operative Plan:   Informed Consent: I have reviewed the patients History and Physical, chart, labs and discussed the procedure including the risks, benefits and alternatives for the proposed anesthesia with the patient or authorized representative who has indicated his/her understanding and  acceptance.       Plan Discussed with: CRNA and Surgeon  Anesthesia Plan Comments: (Dr Jerene Pitch [facility]) has classified this C/S as "urgent" prompting me to likely need to break from supervision temporarily and begin the case myself while the backup team arrives.  The patient last had a cajun biscuit at 9am, informed about increased risk of vomiting/aspiration.  Discussed R/B/A of neuraxial anesthesia technique with patient: - rare risks of spinal/epidural hematoma, nerve damage, infection - Risk of PDPH - Risk of itching - Risk of nausea and vomiting - Risk of conversion to general anesthesia and its associated risks, including sore throat, damage to lips/teeth/oropharynx, and rare risks such as cardiac and respiratory events. - Risk of surgical bleeding requiring blood products Patient voiced understanding. Patient informed about increased incidence of above perioperative risk due to high BMI. Patient understands.  )        Anesthesia Quick Evaluation

## 2022-01-07 NOTE — Discharge Summary (Signed)
OB Discharge Summary     Patient Name: Cindy Rodriguez DOB: 07-27-1995 MRN: [id number]  Date of admission: 01/07/2022 Delivering provider: Tresea Mall, CNM  Date of Delivery: 01/07/2022  Date of discharge: 01/09/2022  Admitting diagnosis: Uterine contractions at greater than 20 weeks of gestation [O47.9] Pregnancy [Z34.90] Intrauterine pregnancy: [redacted]w[redacted]d     Secondary diagnosis: TOLAC     Discharge diagnosis: VBAC                                                                                                Post partum procedures: NA  Augmentation: AROM and Pitocin  Complications: Trichomonas Vaginalis in the third trimester    Hospital course:  Onset of Labor With Vaginal Delivery      26 y.o. yo G3P1011 at [redacted]w[redacted]d was admitted in Active Labor on 01/07/2022. Patient had an uncomplicated labor course as follows:  Membrane Rupture Time/Date: 8:25 AM ,01/07/2022   Delivery Method:VBAC, Spontaneous  Episiotomy: None  Lacerations:  small hemostatic right labial abrasion, small hemostatic left peri-urethral abrasion See delivery note for details  Patient had an uncomplicated postpartum course.  She is tolerating regular diet, her pain is controlled with PO medication, she is ambulating and voiding without difficulty. Bleeding is WNL. Mood is good.  Is formula feeding.  Her sister will be around for support.   *ID and pharm MD consulted regarding positive trich and being treated with IV Flagyl x 2 while in labor, pt unable to swallow large pills.  Both recommend treatment plus trying an antiemetic prior to the medication. PT offered full course of  treatment using Flagyl 250mg  tablets, which are smaller or a 1 time dose of Tinidazole. Pt prefers Tinidazole.   Patient is discharged home in stable condition on 01/09/22.  Newborn Data: Birth date:01/07/2022  Birth time:3:37 PM  Gender: female, 01/09/2022 Living status: living Apgars: 7, 8 Weight: 7 pounds 3 ounces  Physical exam  Vitals:    01/08/22 1524 01/08/22 1940 01/08/22 2324 01/09/22 0746  BP: 110/72 115/62 117/69 119/71  Pulse: 66 87 80 65  Resp: 20 18 20 18   Temp: 98.2 F (36.8 C) 98.4 F (36.9 C) 98.4 F (36.9 C) 97.9 F (36.6 C)  TempSrc: Oral Oral  Oral  SpO2:  99% 97% 100%  Weight:      Height:       General: alert Lochia: appropriate Uterine Fundus: firm Incision: N/A Perineum: intact, minimal swelling.  Extremities: trace BLE  DVT Evaluation: No evidence of DVT seen on physical exam.  Labs: Lab Results  Component Value Date   WBC 17.4 (H) 01/08/2022   HGB 9.7 (L) 01/08/2022   HCT 29.4 (L) 01/08/2022   MCV 83.1 01/08/2022   PLT 180 01/08/2022    Discharge instruction: per After Visit Summary.  Medications:  Allergies as of 01/09/2022       Reactions   Mango Butter Itching   Mangos        Medication List     STOP taking these medications    aspirin EC 81 MG tablet   metroNIDAZOLE 0.75 % vaginal gel Commonly  known as: METROGEL   metroNIDAZOLE 500 MG tablet Commonly known as: FLAGYL       TAKE these medications    acetaminophen 325 MG tablet Commonly known as: TYLENOL Take 2 tablets (650 mg total) by mouth every 4 (four) hours as needed (for pain scale < 4  OR  temperature  >/=  100.5 F).   ferrous sulfate 325 (65 FE) MG tablet Commonly known as: FerrouSul Take 1 tablet (325 mg total) by mouth 2 (two) times daily.   ibuprofen 100 MG/5ML suspension Commonly known as: ADVIL Take 30 mLs (600 mg total) by mouth every 6 (six) hours.   medroxyPROGESTERone 150 MG/ML injection Commonly known as: DEPO-PROVERA Inject 1 mL (150 mg total) into the muscle every 3 (three) months.   ondansetron 4 MG disintegrating tablet Commonly known as: ZOFRAN-ODT Take 1 tablet (4 mg total) by mouth 2 (two) times daily.   simethicone 80 MG chewable tablet Commonly known as: MYLICON Chew 1 tablet (80 mg total) by mouth as needed for flatulence.   tinidazole 500 MG tablet Commonly known  as: TINDAMAX Take 4 tablets (2,000 mg total) by mouth once for 1 dose. Take with food               Discharge Care Instructions  (From admission, onward)           Start     Ordered   01/09/22 0000  Discharge wound care:       Comments: SHOWER DAILY Wash incision gently with soap and water.  Call office with any drainage, redness, or firmness of the incision.   01/09/22 0929            Diet: routine diet  Activity: Advance as tolerated. Pelvic rest for 6 weeks.   Outpatient follow up:  Follow-up Information     Tresea Mall, CNM. Schedule an appointment as soon as possible for a visit in 6 week(s).   Specialty: Obstetrics Why: postpartum follow up visit and sooner as needed Contact information: [address] [place] [address] [phone]                   Postpartum contraception: Depo Provera to be given at discharge. Discussed progesterone only IUD as another option, will discuss further at 6wk visit.  Rhogam Given postpartum: Rh positive Rubella vaccine given postpartum: to be given at discharge  Varicella vaccine given postpartum: immune TDaP given antepartum or postpartum: given antepartum   Newborn Delivery   Birth date/time: 01/07/2022 15:37:00 Delivery type: VBAC, Spontaneous      Baby Feeding:  formula  Disposition:home with mother  SIGNEDEllouise Newer [facility], CNM 01/09/2022 10:27 AM

## 2022-01-08 LAB — CBC
HCT: 29.4 % — ABNORMAL LOW (ref 36.0–46.0)
Hemoglobin: 9.7 g/dL — ABNORMAL LOW (ref 12.0–15.0)
MCH: 27.4 pg (ref 26.0–34.0)
MCHC: 33 g/dL (ref 30.0–36.0)
MCV: 83.1 fL (ref 80.0–100.0)
Platelets: 180 10*3/uL (ref 150–400)
RBC: 3.54 MIL/uL — ABNORMAL LOW (ref 3.87–5.11)
RDW: 15.2 % (ref 11.5–15.5)
WBC: 17.4 10*3/uL — ABNORMAL HIGH (ref 4.0–10.5)
nRBC: 0 % (ref 0.0–0.2)

## 2022-01-08 NOTE — Discharge Instructions (Addendum)
Discharge Instructions:   Follow-up Appointment:  If there are any new medications, they have been ordered and will be available for pickup at the listed pharmacy on your way home from the hospital.   Call office if you have any of the following: headache, visual changes, fever >101.0 F, chills, shortness of breath, breast concerns, excessive vaginal bleeding, incision drainage or problems, leg pain or redness, depression or any other concerns. If you have vaginal discharge with an odor, let your doctor know.   It is normal to bleed for up to 6 weeks. You should not soak through more than 1 pad in 1 hour. If you have a blood clot larger than your fist with continued bleeding, call your doctor.   Activity: Do not lift > 10 lbs for 6 weeks (do not lift anything heavier than your baby). No intercourse, tampons, swimming pools, hot tubs, baths (only showers) for 6 weeks.  No driving for 1-2 weeks. Continue prenatal vitamin, especially if breastfeeding. Increase calories and fluids (water) while breastfeeding.   Your milk will come in, in the next couple of days (right now it is colostrum). You may have a slight fever when your milk comes in, but it should go away on its own.  If it does not, and rises above 101 F please call the doctor. You will also feel achy and your breasts will be firm. They will also start to leak. If you are breastfeeding, continue as you have been and you can pump/express milk for comfort.   If you have too much milk, your breasts can become engorged, which could lead to mastitis. This is an infection of the milk ducts. It can be very painful and you will need to notify your doctor to obtain a prescription for antibiotics. You can also treat it with a shower or hot/cold compress.   For concerns about your baby, please call your pediatrician.  For breastfeeding concerns, the lactation consultant can be reached at [phone].   Postpartum blues (feelings of happy one minute  and sad another minute) are normal for the first few weeks but if it gets worse let your doctor know.   Congratulations! We enjoyed caring for you and your new bundle of joy!    Case Management for At-Risk Children [facility]) [phone]   Formerly Care Coordination for Children [facility]). As of January 22, 2020, the Care Coordination for Children Program has changed to a new service called Case Management for At-Risk Children [facility]). [facility] is a free and voluntary program that helps families like yours find and use community services. [facility] care managers can help with finding medical care, transportation, childcare and /or financial aid. They can also provide you with information about a wide variety of family-oriented resources.      The goals of [facility] are:   To connect your family with services for children and families   To support your children in reaching their developmental potential   To help ensure that children are raised in a healthy, safe, and nurturing environment

## 2022-01-08 NOTE — TOC Initial Note (Addendum)
Transition of Care [facility]) - Initial/Assessment Note    Patient Details  Name: Cindy Rodriguez MRN: [id number] Date of Birth: November 04, 1995  Transition of Care [facility]) CM/SW Contact:    Merrily Brittle, LCSWA Phone Number: 01/08/2022, 12:07 PM  Clinical Narrative:                  [facility] consult for substance use concerns.   11:03: CSW attempted to speak to patient at bedside and was unable to speak to the patient.   12:24: CSW talked to patient and learned she lives by herself but has a strong familial support system. Patient denied substance use and mental health concerns. CSW encouraged patient to reach out to [facility] for follow up case management services. CSW reviewed PPD symptoms with patient. Patient reported having OBGYN and pedication planned for the newborn. Patient reported having necessary baby items.  12:30:Patient and newborn test positive for marijuana. CSW awaiting callback from CPS to make report.  1:20: CSW spoke to CPS SW and made report.   No further TOC needs at this time.       Patient Goals and CMS Choice        Expected Discharge Plan and Services                                                Prior Living Arrangements/Services                       Activities of Daily Living Home Assistive Devices/Equipment: None ADL Screening (condition at time of admission) Patient's cognitive ability adequate to safely complete daily activities?: Yes Is the patient deaf or have difficulty hearing?: No Does the patient have difficulty seeing, even when wearing glasses/contacts?: No Does the patient have difficulty concentrating, remembering, or making decisions?: No Patient able to express need for assistance with ADLs?: Yes Does the patient have difficulty dressing or bathing?: No Independently performs ADLs?: Yes (appropriate for developmental age) Does the patient have difficulty walking or climbing stairs?: No Weakness of Legs:  None Weakness of Arms/Hands: None  Permission Sought/Granted                  Emotional Assessment              Admission diagnosis:  Uterine contractions at greater than 20 weeks of gestation [O47.9] Pregnancy [Z34.90] Patient Active Problem List   Diagnosis Date Noted   [redacted] weeks gestation of pregnancy 01/07/2022   VBAC, delivered, current hospitalization 01/07/2022   Postpartum care following vaginal delivery 01/07/2022   Pregnancy 01/07/2022   Uterine contractions at greater than 20 weeks of gestation 01/06/2022   Uterine contractions 01/05/2022   Labor and delivery, indication for care 01/05/2022   Desires VBAC (vaginal birth after cesarean) trial 12/15/2021   Previous cesarean delivery affecting pregnancy 12/07/2021   Round ligament pain 09/08/2021   Obesity affecting pregnancy 08/12/2021   Supervision of high risk pregnancy, antepartum 07/06/2021   Trichomonal vaginitis during pregnancy in third trimester 05/08/2019   History of COVID-19 05/05/2019   Tobacco use in pregnancy, antepartum 05/05/2019   Marijuana use during pregnancy 05/05/2019   Morbid obesity (HCC) 10/18/2017   PCP:  [facility] Pharmacy:   [facility] [phone] - Closed - [place], [place] - [address]  [place] [address] Phone: [phone] Fax: [phone]  [facility] [place], [place] - [address] [place] [address] Phone: [phone] Fax: [phone]  [id number] [place], [place] - [address] AT [facility] OF [address] [place] [address] Phone: [phone] Fax: [phone]     Social Determinants of Health (SDOH) Interventions    Readmission Risk Interventions     No data to display

## 2022-01-08 NOTE — Progress Notes (Signed)
Post Partum Day 1 Subjective: no complaints, up ad lib, voiding, tolerating PO, and she has been outside to smoke. Declines offers of a nicotine patch. She is smiling this morning and in good spirits.  Objective: Blood pressure 103/71, pulse 73, temperature 98.6 F (37 C), temperature source Oral, resp. rate 18, height 5\' 1"  (1.549 m), weight 111 kg, last menstrual period 04/04/2021, SpO2 99 %, unknown if currently breastfeeding.  Physical Exam:  General: alert, cooperative, and no distress Lochia: appropriate Uterine Fundus: firm Incision: non lacerations DVT Evaluation: No evidence of DVT seen on physical exam. Negative Homan's sign. No significant calf/ankle edema.  Recent Labs    01/07/22 0415 01/08/22 0626  HGB 10.8* 9.7*  HCT 32.5* 29.4*    Assessment/Plan: Plan for discharge tomorrow, Social Work consult, and Contraception OCPs or IUD.   LOS: 1 day   01/10/22 01/08/2022, 10:13 AM

## 2022-01-09 MED ORDER — ACETAMINOPHEN 325 MG PO TABS: 650.0000 mg | ORAL_TABLET | ORAL | Status: AC | PRN

## 2022-01-09 MED ORDER — SIMETHICONE 80 MG PO CHEW: 80.0000 mg | CHEWABLE_TABLET | ORAL | 0 refills | Status: AC | PRN

## 2022-01-09 MED ORDER — TINIDAZOLE 500 MG PO TABS: 2.0000 g | ORAL_TABLET | Freq: Once | ORAL | 0 refills | Status: AC

## 2022-01-09 MED ORDER — MEDROXYPROGESTERONE ACETATE 150 MG/ML IM SUSP: 150.0000 mg | INTRAMUSCULAR | 3 refills | Status: AC

## 2022-01-09 MED ORDER — FERROUS SULFATE 325 (65 FE) MG PO TABS: 325.0000 mg | ORAL_TABLET | Freq: Two times a day (BID) | ORAL | 1 refills | Status: AC

## 2022-01-09 MED ORDER — METRONIDAZOLE 500 MG PO TABS: 500.0000 mg | ORAL_TABLET | Freq: Two times a day (BID) | ORAL | 0 refills | Status: DC

## 2022-01-09 MED ORDER — ONDANSETRON 4 MG PO TBDP: 4.0000 mg | ORAL_TABLET | Freq: Two times a day (BID) | ORAL | 0 refills | Status: DC

## 2022-01-09 MED ORDER — IBUPROFEN 100 MG/5ML PO SUSP: 600.0000 mg | Freq: Four times a day (QID) | ORAL | Status: AC

## 2022-01-09 NOTE — Anesthesia Postprocedure Evaluation (Signed)
Anesthesia Post Note  Patient: Cindy Rodriguez  Procedure(s) Performed: AN AD HOC LABOR EPIDURAL  Patient location during evaluation: Mother Baby Anesthesia Type: Epidural Level of consciousness: awake and alert Pain management: pain level controlled Vital Signs Assessment: post-procedure vital signs reviewed and stable Respiratory status: spontaneous breathing, nonlabored ventilation and respiratory function stable Cardiovascular status: stable Postop Assessment: no headache, no backache, able to ambulate and adequate PO intake Anesthetic complications: no   No notable events documented.   Last Vitals: There were no vitals filed for this visit.  Last Pain: There were no vitals filed for this visit.               [name]

## 2022-01-09 NOTE — Progress Notes (Signed)
Mother discharged.  Discharge instructions given.  Mother verbalizes understanding.  Transported by auxiliary.  

## 2022-02-06 ENCOUNTER — Other Ambulatory Visit: Payer: Medicaid Other

## 2022-05-12 ENCOUNTER — Ambulatory Visit: Payer: Medicaid Other | Admitting: Licensed Practical Nurse

## 2022-05-12 ENCOUNTER — Telehealth: Payer: Self-pay | Admitting: Licensed Practical Nurse

## 2022-05-12 NOTE — Telephone Encounter (Signed)
Reached out to pt to reschedule postpartum and STD check appt with LMD at 3:35.  Call could not be completed as dialed.  Called twice.

## 2022-05-16 ENCOUNTER — Encounter: Payer: Self-pay | Admitting: Licensed Practical Nurse

## 2022-05-16 NOTE — Telephone Encounter (Signed)
Reached out to pt to reschedule postpartum and STD check appt with LMD at 3:35.  Call could not be completed as dialed.  Called twice.  Will send a MyChart letter.

## 2022-07-24 ENCOUNTER — Emergency Department
Admission: EM | Admit: 2022-07-24 | Discharge: 2022-07-24 | Disposition: A | Discharge status: Home / Self Care | Payer: Medicaid Other | Attending: Emergency Medicine | Admitting: Emergency Medicine

## 2022-07-24 ENCOUNTER — Other Ambulatory Visit: Payer: Self-pay

## 2022-07-24 DIAGNOSIS — L02413 Cutaneous abscess of right upper limb: Secondary | ICD-10-CM | POA: Diagnosis not present

## 2022-07-24 DIAGNOSIS — M7989 Other specified soft tissue disorders: Secondary | ICD-10-CM | POA: Diagnosis present

## 2022-07-24 DIAGNOSIS — L02419 Cutaneous abscess of limb, unspecified: Secondary | ICD-10-CM

## 2022-07-24 MED ORDER — SULFAMETHOXAZOLE-TRIMETHOPRIM 200-40 MG/5ML PO SUSP: 20.0000 mL | Freq: Two times a day (BID) | ORAL | 0 refills | Status: AC

## 2022-07-24 MED ORDER — HYDROCODONE-ACETAMINOPHEN 7.5-325 MG/15ML PO SOLN: 15.0000 mL | Freq: Four times a day (QID) | ORAL | 0 refills | Status: AC | PRN

## 2022-07-24 MED ORDER — LIDOCAINE HCL (PF) 1 % IJ SOLN
5.0000 mL | Freq: Once | INTRAMUSCULAR | Status: DC
  Filled 2022-07-24: qty 5

## 2022-07-24 MED ORDER — LIDOCAINE-EPINEPHRINE-TETRACAINE (LET) TOPICAL GEL
3.0000 mL | Freq: Once | TOPICAL | Status: AC
  Administered 2022-07-24: 3 mL via TOPICAL
  Filled 2022-07-24: qty 3

## 2022-07-24 NOTE — ED Provider Notes (Signed)
[facility] Provider Note    Event Date/Time   First MD Initiated Contact with Patient 07/24/22 1716     (approximate)   History   Abscess   HPI  Cindy Rodriguez is a 27 y.o. female with no significant past medical history presents to the emergency department for treatment and evaluation abscess under her right arm.  Area has been tender for over a week and over the past 24 hours has become extremely painful.  Area is not draining.  She has not had any fever.  No alleviating measures attempted.  No previous abscess/MRSA.     Physical Exam   Triage Vital Signs: ED Triage Vitals  Enc Vitals Group     BP 07/24/22 1635 128/89     Pulse Rate 07/24/22 1635 73     Resp 07/24/22 1635 20     Temp 07/24/22 1635 98.5 F (36.9 C)     Temp Source 07/24/22 1635 Oral     SpO2 07/24/22 1635 100 %     Weight 07/24/22 1634 236 lb (107 kg)     Height 07/24/22 1634 5\' 2"  (1.575 m)     Head Circumference --      Peak Flow --      Pain Score 07/24/22 1634 10     Pain Loc --      Pain Edu? --      Excl. in [place]? --     Most recent vital signs: Vitals:   07/24/22 1635  BP: 128/89  Pulse: 73  Resp: 20  Temp: 98.5 F (36.9 C)  SpO2: 100%     General: Awake, no distress.  CV:  Good peripheral perfusion.  Resp:  Normal effort.  Abd:  No distention.  Other:  Approximately 3 cm x 2 cm area of fluctuance with surrounding induration in the right axillary area.   ED Results / Procedures / Treatments   Labs (all labs ordered are listed, but only abnormal results are displayed) Labs Reviewed - No data to display   EKG     RADIOLOGY  Not indicated   PROCEDURES:  Critical Care performed: No  ..Incision and Drainage  Date/Time: 07/31/2022 11:09 PM  Performed by: Victorino Dike, FNP Authorized by: Victorino Dike, FNP   Consent:    Consent obtained:  Verbal   Consent given by:  Patient   Risks discussed:  Bleeding, incomplete drainage, pain  and damage to other organs   Alternatives discussed:  No treatment Universal protocol:    Procedure explained and questions answered to patient or proxy's satisfaction: yes     Relevant documents present and verified: yes     Required blood products, implants, devices, and special equipment available: yes     Patient identity confirmed:  Verbally with patient Location:    Type:  Abscess   Location:  Upper extremity   Upper extremity location:  Arm   Arm location:  R upper arm Pre-procedure details:    Skin preparation:  Betadine Anesthesia:    Anesthesia method:  Local infiltration   Local anesthetic:  Lidocaine 1% WITH epi Procedure type:    Complexity:  Complex Procedure details:    Incision types:  Single straight   Incision depth:  Subcutaneous   Wound management:  Probed and deloculated, irrigated with saline and extensive cleaning   Drainage:  Purulent   Drainage amount:  Moderate   Packing materials:  1/4 in gauze Post-procedure details:  Procedure completion:  Tolerated well, no immediate complications    MEDICATIONS ORDERED IN ED: Medications  lidocaine-EPINEPHrine-tetracaine (LET) topical gel (3 mLs Topical Given 07/24/22 1724)     IMPRESSION / MDM / ASSESSMENT AND PLAN / ED COURSE  I reviewed the triage vital signs and the nursing notes.                              Differential diagnosis includes, but is not limited to, staph skin infection, MRSA, ingrown hair, hidradenitis  Patient's presentation is most consistent with acute illness / injury with system symptoms.  27 year old female presenting to the emergency department for treatment and evaluation of abscess under the right arm that is been present for approximately 1 week.  No previous abscess history.  Wound drained as described above.  Patient tolerated the procedure well.  She will pull the packing in approximately 3 days if there is no more drainage.  If the area continues to drain, she was advised  to see primary care or return to the emergency department for follow-up evaluation and potential repacking.  She was encouraged to be evaluated sooner if she develops a fever or other concerning symptoms.     FINAL CLINICAL IMPRESSION(S) / ED DIAGNOSES   Final diagnoses:  Abscess of axillary region     Rx / DC Orders   ED Discharge Orders          Ordered    sulfamethoxazole-trimethoprim (BACTRIM) 200-40 MG/5ML suspension  2 times daily        07/24/22 1811    HYDROcodone-acetaminophen (HYCET) 7.5-325 mg/15 ml solution  Every 6 hours PRN        07/24/22 1811             Note:  This document was prepared using Dragon voice recognition software and may include unintentional dictation errors.   Victorino Dike, FNP 07/31/22 2310    Nathaniel Man, MD 07/31/22 2334

## 2022-07-24 NOTE — Discharge Instructions (Signed)
Remove the packing in 3 days if the drainage has stopped.  If it continues to drain, leave the packing in place and return to the ER.

## 2022-07-24 NOTE — ED Triage Notes (Signed)
Pt reports abscess under her right arm for over a week.

## 2022-07-24 NOTE — ED Provider Triage Note (Signed)
Emergency Medicine Provider Triage Evaluation Note  Nanda Bittick, a 27 y.o. female  was evaluated in triage.  Pt complains of right axilla abscess.  Denies any spontaneous drainage.  She denies any fever chills or sweats.  Review of Systems  Positive: Right axilla abscess Negative: FCS  Physical Exam  There were no vitals taken for this visit. Gen:   Awake, no distress  Resp:  Normal effort  MSK:   Moves extremities without difficulty  Other:    Medical Decision Making  Medically screening exam initiated at 4:34 PM.  Appropriate orders placed.  Tollie Eth Jared was informed that the remainder of the evaluation will be completed by another provider, this initial triage assessment does not replace that evaluation, and the importance of remaining in the ED until their evaluation is complete.  Patient to the ED for evaluation of a right axilla abscess developing over the last few days.   Melvenia Needles, PA-C 07/24/22 1635

## 2022-07-25 ENCOUNTER — Encounter: Payer: Self-pay | Admitting: Obstetrics and Gynecology

## 2022-07-25 ENCOUNTER — Emergency Department
Admission: EM | Admit: 2022-07-25 | Discharge: 2022-07-25 | Disposition: A | Discharge status: Home / Self Care | Payer: Medicaid Other | Attending: Emergency Medicine | Admitting: Emergency Medicine

## 2022-07-25 ENCOUNTER — Encounter: Payer: Self-pay | Admitting: Emergency Medicine

## 2022-07-25 ENCOUNTER — Other Ambulatory Visit: Payer: Self-pay

## 2022-07-25 DIAGNOSIS — L02411 Cutaneous abscess of right axilla: Secondary | ICD-10-CM | POA: Diagnosis not present

## 2022-07-25 DIAGNOSIS — Z5189 Encounter for other specified aftercare: Secondary | ICD-10-CM | POA: Insufficient documentation

## 2022-07-25 NOTE — ED Triage Notes (Signed)
Pt to ED from home c/o right axilla abscess drainage.  States was here last night and had I&D with packing placed.  Told to remove packing after 3 days if not out on it's own.  States called [facility] earlier stating "string" had fallen out and was referred to come to ED.  Denies fevers or other complaints, area still draining.  Cannon Kettle, PA in triage for MSE.

## 2022-07-25 NOTE — ED Provider Notes (Signed)
    [facility] Emergency Department Provider Note     Event Date/Time   First MD Initiated Contact with Patient 07/25/22 2022     (approximate)   History   Abscess   HPI  Cindy Rodriguez is a 27 y.o. female returns to the ED, 1 day s/p right axilla I&D. She was concerned after the packing was removed inadvertently.  she denies FCS, but noted continued drainage.   Physical Exam   Triage Vital Signs: ED Triage Vitals  Enc Vitals Group     BP 07/25/22 2012 (!) 125/94     Pulse Rate 07/25/22 2012 77     Resp 07/25/22 2012 16     Temp 07/25/22 2012 98.4 F (36.9 C)     Temp Source 07/25/22 2012 Oral     SpO2 07/25/22 2012 100 %     Weight 07/25/22 2011 234 lb (106.1 kg)     Height 07/25/22 2011 5\' 2"  (1.575 m)     Head Circumference --      Peak Flow --      Pain Score 07/25/22 2011 0     Pain Loc --      Pain Edu? --      Excl. in [place]? --     Most recent vital signs: Vitals:   07/25/22 2012  BP: (!) 125/94  Pulse: 77  Resp: 16  Temp: 98.4 F (36.9 C)  SpO2: 100%    General Awake, no distress. NAD CV:  Good peripheral perfusion.  RESP:  Normal effort.  ABD:  No distention.  SKIN:  Right axilla with a flat, cellulitic lesion with some central eschar.    ED Results / Procedures / Treatments   Labs (all labs ordered are listed, but only abnormal results are displayed) Labs Reviewed - No data to display   EKG    RADIOLOGY  No results found.   PROCEDURES:  Critical Care performed: No  Procedures   MEDICATIONS ORDERED IN ED: Medications - No data to display   IMPRESSION / MDM / [place] / ED COURSE  I reviewed the triage vital signs and the nursing notes.                              Differential diagnosis includes, but is not limited to, abscess, cellulitis, lymphangitis  Patient's presentation is most consistent with acute, uncomplicated illness.  Patient's diagnosis is consistent with  well-healing abscess s/p I&D. Patient will be discharged home with instructions on continued wound care. Patient is to follow up with her PCP as needed or otherwise directed. Patient is given ED precautions to return to the ED for any worsening or new symptoms.     FINAL CLINICAL IMPRESSION(S) / ED DIAGNOSES   Final diagnoses:  Wound check, abscess     Rx / DC Orders   ED Discharge Orders     None        Note:  This document was prepared using Dragon voice recognition software and may include unintentional dictation errors.    Melvenia Needles, PA-C 07/25/22 2026    Harvest Dark, MD 07/25/22 2238

## 2022-07-25 NOTE — Discharge Instructions (Signed)
Keep the wound clean, dry, and covered. Apply warm compresses to promote healing.

## 2023-10-16 ENCOUNTER — Other Ambulatory Visit: Payer: Self-pay

## 2023-10-16 ENCOUNTER — Emergency Department
Admission: EM | Admit: 2023-10-16 | Discharge: 2023-10-16 | Disposition: A | Discharge status: Home / Self Care | Attending: Emergency Medicine | Admitting: Emergency Medicine

## 2023-10-16 ENCOUNTER — Emergency Department

## 2023-10-16 ENCOUNTER — Encounter: Payer: Self-pay | Admitting: Emergency Medicine

## 2023-10-16 DIAGNOSIS — Y92410 Unspecified street and highway as the place of occurrence of the external cause: Secondary | ICD-10-CM | POA: Insufficient documentation

## 2023-10-16 DIAGNOSIS — M542 Cervicalgia: Secondary | ICD-10-CM | POA: Diagnosis not present

## 2023-10-16 DIAGNOSIS — S0990XA Unspecified injury of head, initial encounter: Secondary | ICD-10-CM | POA: Insufficient documentation

## 2023-10-16 MED ORDER — ACETAMINOPHEN 160 MG/5ML PO SOLN
650.0000 mg | Freq: Once | ORAL | Status: AC
  Administered 2023-10-16: 650 mg via ORAL
  Filled 2023-10-16: qty 20.3

## 2023-10-16 MED ORDER — ACETAMINOPHEN 500 MG PO TABS
1000.0000 mg | ORAL_TABLET | Freq: Once | ORAL | Status: DC
  Filled 2023-10-16: qty 2
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
## 2023-12-24 ENCOUNTER — Other Ambulatory Visit: Payer: Self-pay

## 2023-12-24 ENCOUNTER — Emergency Department
Admission: EM | Admit: 2023-12-24 | Discharge: 2023-12-24 | Disposition: A | Discharge status: Home / Self Care | Attending: Emergency Medicine | Admitting: Emergency Medicine

## 2023-12-24 DIAGNOSIS — R519 Headache, unspecified: Secondary | ICD-10-CM | POA: Insufficient documentation

## 2023-12-24 DIAGNOSIS — R059 Cough, unspecified: Secondary | ICD-10-CM | POA: Insufficient documentation

## 2023-12-24 DIAGNOSIS — R112 Nausea with vomiting, unspecified: Secondary | ICD-10-CM | POA: Diagnosis not present

## 2023-12-24 DIAGNOSIS — H53149 Visual discomfort, unspecified: Secondary | ICD-10-CM | POA: Diagnosis not present

## 2023-12-24 LAB — POC URINE PREG, ED: Preg Test, Ur: NEGATIVE

## 2023-12-24 MED ORDER — KETOROLAC TROMETHAMINE 30 MG/ML IJ SOLN
30.0000 mg | Freq: Once | INTRAMUSCULAR | Status: AC
  Administered 2023-12-24: 30 mg via INTRAMUSCULAR
  Filled 2023-12-24: qty 1

## 2023-12-24 MED ORDER — PROCHLORPERAZINE EDISYLATE 10 MG/2ML IJ SOLN
10.0000 mg | Freq: Once | INTRAMUSCULAR | Status: AC
  Administered 2023-12-24: 10 mg via INTRAMUSCULAR
  Filled 2023-12-24: qty 2

## 2023-12-24 MED ORDER — ONDANSETRON 4 MG PO TBDP: 4.0000 mg | ORAL_TABLET | Freq: Three times a day (TID) | ORAL | 0 refills | Status: AC | PRN

## 2023-12-24 NOTE — ED Notes (Signed)
 See triage note  Presents with a 3 day hx of headache  States pain is frontal area  Low grade temp on arrival

## 2023-12-24 NOTE — ED Triage Notes (Signed)
 Pt comes with ,migraine for  three days. Pt states no hx of this. Pt states worse when she lays day.

## 2023-12-24 NOTE — Discharge Instructions (Addendum)
 You are seen in the emergency department for a headache.  You are given medications in the emergency department and you had a normal neurologic exam.  Your pregnancy test was negative.  Pain control:  Ibuprofen  (motrin /aleve/advil ) - You can take 3 tablets (600 mg) every 6 hours as needed for pain/fever.  Acetaminophen  (tylenol ) - You can take 2 extra strength tablets (1000 mg) every 6 hours as needed for pain/fever.  You can alternate these medications or take them together.  Make sure you eat food/drink water when taking these medications.  zofran  (ondansetron ) - nausea medication, take 1 tablet every 8 hours as needed for nausea/vomiting.

## 2023-12-24 NOTE — ED Provider Notes (Signed)
 [facility] Provider Note    Event Date/Time   First MD Initiated Contact with Patient 12/24/23 [phone]     (approximate)   History   Migraine   HPI  Cindy Rodriguez is a 28 y.o. female no significant past medical history presents to the emergency department with a headache.  States that she has had 3 days of a headache to the front of her head with a pressure sensation.  Denies sudden onset of headache and states that it has progressively worsened over the past couple of days.  Endorses a mild cough but no congestion.  No falls or head trauma.  Does endorse some photophobia with 1 episode of nausea and vomiting after coughing.  Denies any neck pain, denies any extremity numbness or weakness.  Denies concern for pregnancy.  No prior history of headaches.  States that it does feel somewhat worse in the morning time.     Physical Exam   Triage Vital Signs: ED Triage Vitals  Encounter Vitals Group     BP 12/24/23 0728 124/88     Systolic BP Percentile --      Diastolic BP Percentile --      Pulse Rate 12/24/23 0728 86     Resp 12/24/23 0728 19     Temp 12/24/23 0728 99 F (37.2 C)     Temp src --      SpO2 12/24/23 0728 96 %     Weight 12/24/23 0727 215 lb (97.5 kg)     Height 12/24/23 0727 5\' 1"  (1.549 m)     Head Circumference --      Peak Flow --      Pain Score 12/24/23 0726 9     Pain Loc --      Pain Education --      Exclude from Growth Chart --     Most recent vital signs: Vitals:   12/24/23 0728  BP: 124/88  Pulse: 86  Resp: 19  Temp: 99 F (37.2 C)  SpO2: 96%    Physical Exam Constitutional:      Appearance: She is well-developed.  HENT:     Head: Atraumatic.  Eyes:     Extraocular Movements: Extraocular movements intact.     Conjunctiva/sclera: Conjunctivae normal.     Pupils: Pupils are equal, round, and reactive to light.  Cardiovascular:     Rate and Rhythm: Regular rhythm.     Pulses: Normal pulses.  Pulmonary:      Effort: No respiratory distress.  Abdominal:     General: There is no distension.  Musculoskeletal:        General: Normal range of motion.     Cervical back: Normal range of motion. No rigidity or tenderness.  Skin:    General: Skin is warm.     Capillary Refill: Capillary refill takes less than 2 seconds.  Neurological:     Mental Status: She is alert. Mental status is at baseline.     GCS: GCS eye subscore is 4. GCS verbal subscore is 5. GCS motor subscore is 6.     Cranial Nerves: Cranial nerves 2-12 are intact.     Sensory: Sensation is intact.     Motor: Motor function is intact.     Coordination: Coordination is intact.     Gait: Gait is intact.      IMPRESSION / MDM / ASSESSMENT AND PLAN / ED COURSE  I reviewed the triage vital signs and the  nursing notes.  Differential diagnosis including primary headache including migraine, sinus headache, tension headache.  Have a low suspicion for subarachnoid hemorrhage, not thunderclap headache and not the worst headache of her life.  Normal neurologic exam, have low suspicion for acute CVA or intracranial mass.  Does have some positional component but have a lower suspicion for pseudotumor.  Pregnancy test negative  Labs (all labs ordered are listed, but only abnormal results are displayed) Labs interpreted as -    Labs Reviewed  POC URINE PREG, ED    Discussed IV versus IM medication, patient has not taken anything for pain for this headache.  States that she wants to try a round of IM medication.  Does state that she could find a ride home.  Given IM ketorolac  and IM Compazine and will reevaluate.  On reevaluation patient states she is feeling much better.  No longer with nausea or vomiting.  Headache is improved.  No change in vision.  Discussed close follow-up with primary care physician, states that she called to schedule an appointment.  Discussed ibuprofen  and Tylenol  for pain control.  Discussed return to the emergency  department for any return or worsening symptoms.     PROCEDURES:  Critical Care performed: No  Procedures  Patient's presentation is most consistent with acute complicated illness / injury requiring diagnostic workup.   MEDICATIONS ORDERED IN ED: Medications  ketorolac  (TORADOL ) 30 MG/ML injection 30 mg (30 mg Intramuscular Given 12/24/23 0803)  prochlorperazine (COMPAZINE) injection 10 mg (10 mg Intramuscular Given 12/24/23 0803)    FINAL CLINICAL IMPRESSION(S) / ED DIAGNOSES   Final diagnoses:  Acute nonintractable headache, unspecified headache type     Rx / DC Orders   ED Discharge Orders          Ordered    ondansetron  (ZOFRAN -ODT) 4 MG disintegrating tablet  Every 8 hours PRN        12/24/23 0802             Note:  This document was prepared using Dragon voice recognition software and may include unintentional dictation errors.   Viviano Ground, MD 12/24/23 [phone]
